# Patient Record
Sex: Male | Born: 1940 | Race: Black or African American | Hispanic: No | Marital: Married | Smoking: Former smoker
Health system: Southern US, Community
[De-identification: ages and names within clinical notes are randomized; demographics above are authoritative.]

## PROBLEM LIST (undated history)

## (undated) DIAGNOSIS — I4891 Unspecified atrial fibrillation: Secondary | ICD-10-CM

## (undated) DIAGNOSIS — I5020 Unspecified systolic (congestive) heart failure: Secondary | ICD-10-CM

## (undated) DIAGNOSIS — E7800 Pure hypercholesterolemia, unspecified: Secondary | ICD-10-CM

## (undated) DIAGNOSIS — I10 Essential (primary) hypertension: Secondary | ICD-10-CM

## (undated) HISTORY — PX: HERNIA REPAIR: SHX51

## (undated) HISTORY — PX: OTHER SURGICAL HISTORY: SHX169

## (undated) HISTORY — DX: Pure hypercholesterolemia, unspecified: E78.00

## (undated) HISTORY — DX: Essential (primary) hypertension: I10

## (undated) HISTORY — PX: TONSILLECTOMY: SUR1361

---

## 2010-10-15 ENCOUNTER — Encounter: Payer: Self-pay | Admitting: Pulmonary Disease

## 2010-10-26 ENCOUNTER — Encounter: Payer: Self-pay | Admitting: Pulmonary Disease

## 2010-11-12 ENCOUNTER — Encounter: Payer: Self-pay | Admitting: Pulmonary Disease

## 2010-11-12 ENCOUNTER — Institutional Professional Consult (permissible substitution) (INDEPENDENT_AMBULATORY_CARE_PROVIDER_SITE_OTHER): Payer: Medicare PPO | Admitting: Pulmonary Disease

## 2010-11-12 ENCOUNTER — Other Ambulatory Visit: Payer: Self-pay | Admitting: Pulmonary Disease

## 2010-11-12 DIAGNOSIS — J90 Pleural effusion, not elsewhere classified: Secondary | ICD-10-CM

## 2010-11-12 DIAGNOSIS — I10 Essential (primary) hypertension: Secondary | ICD-10-CM | POA: Insufficient documentation

## 2010-11-17 ENCOUNTER — Encounter: Payer: Self-pay | Admitting: Pulmonary Disease

## 2010-11-17 ENCOUNTER — Other Ambulatory Visit: Payer: Self-pay | Admitting: Pulmonary Disease

## 2010-11-17 ENCOUNTER — Ambulatory Visit (HOSPITAL_COMMUNITY)
Admission: RE | Admit: 2010-11-17 | Discharge: 2010-11-17 | Disposition: A | Discharge status: Home / Self Care | Payer: Medicare PPO | Source: Ambulatory Visit | Attending: Pulmonary Disease | Admitting: Pulmonary Disease

## 2010-11-17 ENCOUNTER — Other Ambulatory Visit: Payer: Self-pay | Admitting: Diagnostic Radiology

## 2010-11-17 DIAGNOSIS — Z9889 Other specified postprocedural states: Secondary | ICD-10-CM

## 2010-11-17 DIAGNOSIS — J90 Pleural effusion, not elsewhere classified: Secondary | ICD-10-CM | POA: Insufficient documentation

## 2010-11-17 LAB — LACTATE DEHYDROGENASE, PLEURAL OR PERITONEAL FLUID: LD, Fluid: 207 U/L — ABNORMAL HIGH (ref 3–23)

## 2010-11-17 LAB — PROTEIN, BODY FLUID: Total protein, fluid: 4.9 g/dL

## 2010-11-17 LAB — GLUCOSE, SEROUS FLUID: Glucose, Fluid: 147 mg/dL

## 2010-11-17 LAB — BODY FLUID CELL COUNT WITH DIFFERENTIAL
Eos, Fluid: 0 %
Neutrophil Count, Fluid: 2 % (ref 0–25)
Other Cells, Fluid: 0 %

## 2010-11-21 LAB — BODY FLUID CULTURE: Culture: NO GROWTH

## 2010-11-24 NOTE — Assessment & Plan Note (Signed)
Summary: large right plural effusion   Visit Type:  Initial Consult Primary Provider/Referring Provider:  Michel Santee eylk  CC:  short of breath x 1 month--mostly with exercise--sometimes he has to get out of bed and sit up to help with the SOB.  History of Present Illness: 69/M for evaluation of dyspnea & rt effusion.. c/o increasing dyspnea x 3 months  'they thought I was having pna , Abx would help , then it would come  back ' On coumadin x 5 yrs for A fibn CXR 10/15/10 >> persitent RLL infiltrate & effusion. CT chest 10/26/10 >> large rt effusion with passive atelectasis of rt lung, mosaic attenuation of left lung, multinodular goiter. CT scans done at [facility] (I am unable to view films)  Denies PND, orthopnea or chest apin c/o pedal edema - unclear if this is related to heart failure or amlodipine.  Preventive Screening-Counseling & Management  Alcohol-Tobacco     Smoking Status: quit     Year Quit: 1972  Current Medications (verified): 1)  Amlodipine Besylate 10 Mg Tabs (Amlodipine Besylate) .... Take 1 Tablet By Mouth Once A Day 2)  Aspirin 81 Mg  Tabs (Aspirin) .... Take 1 Tablet By Mouth Once A Day 3)  Benazepril Hcl 40 Mg Tabs (Benazepril Hcl) .... Take 1 Tablet By Mouth Once A Day 4)  Cardura 4 Mg Tabs (Doxazosin Mesylate) .... Take 1 Tablet By Mouth Once A Day 5)  Coreg 3.125 Mg Tabs (Carvedilol) .... Take 1 Tablet By Mouth Two Times A Day With Food 6)  Coumadin 5 Mg Tabs (Warfarin Sodium) .... Take 5mg  Alternating With 7.5mg  By Mouth 7)  Fish Oil 1000 Mg Caps (Omega-3 Fatty Acids) .... Take 1 Capsule By Mouth Once A Day 8)  Folic Acid 1 Mg Tabs (Folic Acid) .... Take 1 Tablet By Mouth Once A Day 9)  Lasix 20 Mg Tabs (Furosemide) .... Take 2 Tab By Mouth in The Morning and 1 Tab By Mouth Every Evening 10)  Novolin 70/30 70-30 % Susp (Insulin Isophane & Regular) .... Inject Subcutaneously As Directed 11)  Simvastatin 80 Mg Tabs (Simvastatin) .... Take 1 Tab By Mouth At  Bedtime  Allergies (verified): No Known Drug Allergies  Past History:  Family History: Last updated: 12/03/10 mother deceased age --31  sickle cell father deceased age--26  prostate cancer 1 brother killed in [place] 1 sister alive 4 other brothers  Social History: Last updated: 12/03/2010 married retired children  Past Medical History: Diabetes Hypertension  Past Surgical History: 4 hip surgeries----bil hip replacement tonsils removed  Family History: mother deceased age --13  sickle cell father deceased age--26  prostate cancer 1 brother killed in [place] 1 sister alive 4 other brothers  Social History: married retired childrenSmoking Status:  quit  Review of Systems       The patient complains of shortness of breath with activity, shortness of breath at rest, itching, hand/feet swelling, and joint stiffness or pain.  The patient denies productive cough, non-productive cough, coughing up blood, chest pain, irregular heartbeats, acid heartburn, indigestion, loss of appetite, weight change, abdominal pain, difficulty swallowing, sore throat, tooth/dental problems, headaches, nasal congestion/difficulty breathing through nose, sneezing, ear ache, anxiety, depression, rash, change in color of mucus, and fever.    Vital Signs:  Patient profile:   70 year old male Height:      73 inches Weight:      243.13 pounds BMI:     32.19 O2 Sat:  98 % on Room air Temp:     98.6 degrees F oral Pulse rate:   65 / minute BP sitting:   142 / 74  (right arm) Cuff size:   regular  Vitals Entered By: Randell Loop CMA (November 12, 2010 3:55 PM)  O2 Sat at Rest %:  98 O2 Flow:  Room air CC: short of breath x 1 month--mostly with exercise--sometimes he has to get out of bed and sit up to help with the SOB Is Patient Diabetic? No Pain Assessment Patient in pain? no      Comments pt didnt bring any meds with him today   Physical Exam  Additional Exam:  wt 243  November 13, 2010 Gen. Pleasant, well-nourished, in no distress, normal affect ENT - no lesions, no post nasal drip Neck: No JVD, no thyromegaly, no carotid bruits Lungs: no use of accessory muscles,  dullness to percussion on rt, decreased without rales or rhonchi  Cardiovascular: Rhythm regular, heart sounds  normal, no murmurs or gallops, no peripheral edema Abdomen: soft and non-tender, no hepatosplenomegaly, BS normal. Musculoskeletal: No deformities, no cyanosis or clubbing Neuro:  alert, non focal     Impression & Recommendations:  Problem # 1:  PLEURAL EFFUSION, RIGHT (ICD-511.9) Unclear cause of rt effusion Will obtain films from [place] to review. Proced w ith thoacentesis - The risks of the procedure including coughing, bleeding and the small chance of lung puncture requiring chest tube were discussed in great detail. The benefits & alternatives including serial follow up were also discussed.  he will stop coumadin for 5 days prior to procedure Send for cell count, chemistry, culture &  cytology,  If transudative will obtain echo  Orders: Consultation Level IV (16109) Radiology Referral (Radiology)  Medications Added to Medication List This Visit: 1)  Amlodipine Besylate 10 Mg Tabs (Amlodipine besylate) .... Take 1 tablet by mouth once a day 2)  Aspirin 81 Mg Tabs (Aspirin) .... Take 1 tablet by mouth once a day 3)  Benazepril Hcl 40 Mg Tabs (Benazepril hcl) .... Take 1 tablet by mouth once a day 4)  Cardura 4 Mg Tabs (Doxazosin mesylate) .... Take 1 tablet by mouth once a day 5)  Coreg 3.125 Mg Tabs (Carvedilol) .... Take 1 tablet by mouth two times a day with food 6)  Coumadin 5 Mg Tabs (Warfarin sodium) .... Take 5mg  alternating with 7.5mg  by mouth 7)  Fish Oil 1000 Mg Caps (Omega-3 fatty acids) .... Take 1 capsule by mouth once a day 8)  Folic Acid 1 Mg Tabs (Folic acid) .... Take 1 tablet by mouth once a day 9)  Lasix 20 Mg Tabs (Furosemide) .... Take 2 tab by mouth  in the morning and 1 tab by mouth every evening 10)  Novolin 70/30 70-30 % Susp (Insulin isophane & regular) .... Inject subcutaneously as directed 11)  Simvastatin 80 Mg Tabs (Simvastatin) .... Take 1 tab by mouth at bedtime  Patient Instructions: 1)  Copy sent to: Dr Leonia Reader - [place] 2)  Please schedule a follow-up appointment in 2 weeks. 3)  You have fluid collected around your right lung 4)  We have to draw some out for testing 5)  Risks including bleeding & lung puncture requiring tube were discussed 6)  STOP COUMADIN for at least 5 days prior to procedure

## 2010-12-15 LAB — FUNGUS CULTURE W SMEAR: Fungal Smear: NONE SEEN

## 2011-01-01 LAB — AFB CULTURE WITH SMEAR (NOT AT ARMC)

## 2011-04-29 ENCOUNTER — Encounter: Payer: Self-pay | Admitting: Podiatry

## 2011-04-29 DIAGNOSIS — B351 Tinea unguium: Secondary | ICD-10-CM | POA: Insufficient documentation

## 2011-04-29 DIAGNOSIS — E119 Type 2 diabetes mellitus without complications: Secondary | ICD-10-CM | POA: Insufficient documentation

## 2011-04-29 DIAGNOSIS — E7800 Pure hypercholesterolemia, unspecified: Secondary | ICD-10-CM | POA: Insufficient documentation

## 2012-03-02 ENCOUNTER — Ambulatory Visit (INDEPENDENT_AMBULATORY_CARE_PROVIDER_SITE_OTHER): Payer: Medicare PPO | Admitting: Internal Medicine

## 2012-03-02 ENCOUNTER — Encounter: Payer: Self-pay | Admitting: Internal Medicine

## 2012-03-02 ENCOUNTER — Other Ambulatory Visit (INDEPENDENT_AMBULATORY_CARE_PROVIDER_SITE_OTHER): Payer: Medicare PPO

## 2012-03-02 ENCOUNTER — Telehealth: Payer: Self-pay | Admitting: Internal Medicine

## 2012-03-02 ENCOUNTER — Ambulatory Visit (INDEPENDENT_AMBULATORY_CARE_PROVIDER_SITE_OTHER)
Admission: RE | Admit: 2012-03-02 | Discharge: 2012-03-02 | Disposition: A | Discharge status: Home / Self Care | Payer: Medicare PPO | Source: Ambulatory Visit | Attending: Internal Medicine | Admitting: Internal Medicine

## 2012-03-02 VITALS — BP 142/68 | HR 60 | Temp 98.2°F | Ht 73.0 in | Wt 265.2 lb

## 2012-03-02 DIAGNOSIS — R0600 Dyspnea, unspecified: Secondary | ICD-10-CM

## 2012-03-02 DIAGNOSIS — J90 Pleural effusion, not elsewhere classified: Secondary | ICD-10-CM

## 2012-03-02 DIAGNOSIS — R0609 Other forms of dyspnea: Secondary | ICD-10-CM

## 2012-03-02 DIAGNOSIS — R0989 Other specified symptoms and signs involving the circulatory and respiratory systems: Secondary | ICD-10-CM

## 2012-03-02 LAB — CBC
Hemoglobin: 13.7 g/dL (ref 13.0–17.0)
Platelets: 165 10*3/uL (ref 150.0–400.0)
RDW: 15.8 % — ABNORMAL HIGH (ref 11.5–14.6)
WBC: 6.6 10*3/uL (ref 4.5–10.5)

## 2012-03-02 LAB — BRAIN NATRIURETIC PEPTIDE: Pro B Natriuretic peptide (BNP): 300 pg/mL — ABNORMAL HIGH (ref 0.0–100.0)

## 2012-03-02 LAB — ALBUMIN: Albumin: 3.9 g/dL (ref 3.5–5.2)

## 2012-03-02 LAB — BASIC METABOLIC PANEL
Chloride: 104 mEq/L (ref 96–112)
Creatinine, Ser: 1 mg/dL (ref 0.4–1.5)
GFR: 77.94 mL/min (ref >60.00)
Potassium: 4.3 mEq/L (ref 3.5–5.1)

## 2012-03-02 LAB — LACTATE DEHYDROGENASE: LDH: 193 U/L (ref 94–250)

## 2012-03-02 NOTE — Patient Instructions (Signed)
Have blood work and cxr today Will call you with result and advice next step

## 2012-03-02 NOTE — Progress Notes (Signed)
Subjective:    Patient ID: Bruce Price, male    DOB: 07/06/1941, 71 y.o.   MRN: [id number]  HPI 71 year old male. PCP is No primary provider on file. Body mass index is 34.99 kg/(m^2).  reports that he has quit smoking. His smoking use included Cigarettes. He has a 60 pack-year smoking history. He has never used smokeless tobacco.  OV 03/02/2012  Saw Alva 11/12/10 for dyspnea. Had Rt thoracentesis for right pleural effusion 11/17/10 and result was non-diagnostic exudate (WC 7000 mostly lymphs, and LDH 207). AFter that lost to followup per his hx "no one called back". Reports that past 3-4 months having increased dyspnea on exertion that is slightly progressive. At baseline he has class 3 dyspnea and this is still class 3 but somewhat worse now. Denies associated chest pain, cough wheeze, orthoppnea, pnd. 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 Dyspnea relieved by rest. He reports he had CXR at [facility] and showed "fluid collection" and so he is back here.   Past, Family, Social reviewed: no change since last visit    Review of Systems  Constitutional: Negative for fever and unexpected weight change.  HENT: Positive for congestion and postnasal drip. Negative for ear pain, nosebleeds, sore throat, rhinorrhea, sneezing, trouble swallowing, dental problem and sinus pressure.   Eyes: Negative for redness and itching.  Respiratory: Positive for cough, shortness of breath and wheezing. Negative for chest tightness.   Cardiovascular: Negative for palpitations and leg swelling.  Gastrointestinal: Negative for nausea and vomiting.  Genitourinary: Negative for dysuria.  Musculoskeletal: Negative for joint swelling.  Skin: Negative for rash.  Neurological: Negative for headaches.  Hematological: Bruises/bleeds easily.  Psychiatric/Behavioral: Negative for dysphoric mood. The patient is not nervous/anxious.          Objective:   Physical Exam  Nursing note and vitals reviewed. Constitutional: He is oriented to person, place, and time. He appears well-developed and well-nourished. No distress.       Body mass index is 34.99 kg/(m^2).   HENT:  Head: Normocephalic and atraumatic.  Right Ear: External ear normal.  Left Ear: External ear normal.  Mouth/Throat: Oropharynx is clear and moist. No oropharyngeal exudate.  Eyes: Conjunctivae and EOM are normal. Pupils are equal, round, and reactive to light. Right eye exhibits no discharge. Left eye exhibits no discharge. No scleral icterus.  Neck: Normal range of motion. Neck supple. No JVD present. No tracheal deviation present. No thyromegaly present.  Cardiovascular: Normal rate, regular rhythm and intact distal pulses.  Exam reveals no gallop and no friction rub.   No murmur heard. Pulmonary/Chest: Effort normal and breath sounds normal. No respiratory distress. He has no wheezes. He has no rales. He exhibits no tenderness.  Abdominal: Soft. Bowel sounds are normal. He exhibits no distension and no mass. There is no tenderness. There is no rebound and no guarding.  Musculoskeletal: Normal range of motion. He exhibits no edema and no tenderness.       Uses cane  Lymphadenopathy:    He has no cervical adenopathy.  Neurological: He is alert and oriented to person, place, and time. He has normal reflexes. No cranial nerve deficit. Coordination normal.  Skin: Skin is warm and dry. No rash noted. He is not diaphoretic. No erythema. No pallor.  Psychiatric: He has a normal mood and affect. His behavior is normal. Judgment and thought content normal.  Assessment & Plan:

## 2012-03-02 NOTE — Telephone Encounter (Signed)
Dr Vassie Loll patient : Bruce Price no fluid collection and blood tests ok. Please inform him. He next needs PFT - I have ordered. And follwup with alva asap

## 2012-03-02 NOTE — Assessment & Plan Note (Signed)
Need to rule out pleural effusion recurrence as cause of dyspnea. So, wil get CXR and bnp. IF results, negative then will set up for PFT and followup with Dr Vassie Loll

## 2012-03-03 NOTE — Telephone Encounter (Signed)
Called # listed and received a message thanks for calling the party I am trying to reach will be notified I called wcb

## 2012-03-07 NOTE — Telephone Encounter (Signed)
Called # listed and received same message again wcb later

## 2012-03-13 NOTE — Telephone Encounter (Signed)
Called same # again and received same message wcb

## 2012-03-14 ENCOUNTER — Encounter: Payer: Self-pay | Admitting: *Deleted

## 2012-03-14 NOTE — Telephone Encounter (Signed)
ATC pt received same message. Will send pt a letter advising him to call the office

## 2012-03-15 ENCOUNTER — Telehealth: Payer: Self-pay | Admitting: Internal Medicine

## 2012-03-15 NOTE — Telephone Encounter (Signed)
RAMASWAMY,MURALI, MD 03/02/2012 10:40 PM Signed  Dr Vassie Loll patient : CXR no fluid collection and blood tests ok. Please inform him. He next needs PFT - I have ordered. And follwup with alva asap  Pt is aware and Shawna Orleans is setting up PFT and OV with RA.

## 2012-03-23 ENCOUNTER — Ambulatory Visit (INDEPENDENT_AMBULATORY_CARE_PROVIDER_SITE_OTHER): Payer: Medicare PPO | Admitting: Pulmonary Disease

## 2012-03-23 DIAGNOSIS — R0600 Dyspnea, unspecified: Secondary | ICD-10-CM

## 2012-03-23 DIAGNOSIS — R0609 Other forms of dyspnea: Secondary | ICD-10-CM

## 2012-03-23 LAB — PULMONARY FUNCTION TEST

## 2012-03-23 NOTE — Progress Notes (Signed)
PFT done today. 

## 2012-04-06 ENCOUNTER — Telehealth: Payer: Self-pay | Admitting: Internal Medicine

## 2012-04-06 NOTE — Telephone Encounter (Signed)
Pt is coming in 04/14/12 at 4:00. Will let RA know this

## 2012-04-06 NOTE — Telephone Encounter (Signed)
lmomtcb x1 

## 2012-04-06 NOTE — Telephone Encounter (Signed)
pft 03/23/12 - fev1 2L67%,  Ratio 82, TLC 4.6L/66^. DLCO 18/6/69%. All c/w restrction and low diffusion. Pleae give him appt to see Dr Vassie Loll

## 2012-04-06 NOTE — Telephone Encounter (Signed)
Pt returned call. Per mindy I have scheduled pt to see dr Vassie Loll on 04-14-12. Nothing further needed at this time per pt. Hazel Sams

## 2012-04-14 ENCOUNTER — Ambulatory Visit (INDEPENDENT_AMBULATORY_CARE_PROVIDER_SITE_OTHER): Payer: Medicare PPO | Admitting: Pulmonary Disease

## 2012-04-14 ENCOUNTER — Encounter: Payer: Self-pay | Admitting: Pulmonary Disease

## 2012-04-14 VITALS — HR 57 | Temp 98.2°F | Ht 72.0 in | Wt 267.8 lb

## 2012-04-14 DIAGNOSIS — J90 Pleural effusion, not elsewhere classified: Secondary | ICD-10-CM

## 2012-04-14 DIAGNOSIS — R0600 Dyspnea, unspecified: Secondary | ICD-10-CM

## 2012-04-14 DIAGNOSIS — R0989 Other specified symptoms and signs involving the circulatory and respiratory systems: Secondary | ICD-10-CM

## 2012-04-14 NOTE — Progress Notes (Signed)
  Subjective:    Patient ID: Bruce Price, male    DOB: Jul 23, 1941, 71 y.o.   MRN: [id number]  HPI PCP - [facility]  71 year old male heavy ex smoker   He has a 60 pack-year smoking history.   Had Rt thoracentesis for right pleural effusion 11/17/10 and result was non-diagnostic exudate (WC 7000 mostly lymphs, and LDH 207).  He  had CXR at [facility] that showed "fluid collection" but CXR here did not show infx or effusions.  04/14/2012 C/o bipedal edema  CXR 03/02/12 - no effusion. BNp 300 PFTs  03/23/12 - fev1 2L 67%, Ratio 82, TLC 4.6L/66^. DLCO 18/6/69%. All c/w restrction and low diffusion On coumadin for Af, INR was 1.9 last week, increased from 7 to 8 mg ? Compliance with lasix  Review of Systems Patient denies significant dyspnea,cough, hemoptysis,  chest pain, palpitations, pedal edema, orthopnea, paroxysmal nocturnal dyspnea, lightheadedness, nausea, vomiting, abdominal or  leg pains      Objective:   Physical Exam  Gen. Pleasant, obese, in no distress ENT - no lesions, no post nasal drip Neck: No JVD, no thyromegaly, no carotid bruits Lungs: no use of accessory muscles, no dullness to percussion, decreased without rales or rhonchi  Cardiovascular: Rhythm regular, heart sounds  normal, no murmurs or gallops, !+ peripheral edema Musculoskeletal: No deformities, no cyanosis or clubbing , no tremors       Assessment & Plan:

## 2012-04-14 NOTE — Patient Instructions (Addendum)
Weigh yourself every day & keep record Increase lasix 2 pills twice daily until  Weight drops by 5 lbs , then go back to usual dose

## 2012-04-19 NOTE — Assessment & Plan Note (Signed)
resolved 

## 2012-04-19 NOTE — Assessment & Plan Note (Signed)
Ex smoker, mild restriction on PFTs, no obstruction - does not have significant COPD Dyspnea may be due to diastolic dysfunction - have asked him to increase lasix until wt down by 5lbs then go back to usual dose Keep weight chart & fu with cardiologist

## 2012-04-20 ENCOUNTER — Ambulatory Visit: Payer: Medicare PPO | Admitting: Pulmonary Disease

## 2012-10-16 ENCOUNTER — Encounter: Payer: Self-pay | Admitting: Adult Health

## 2012-10-16 ENCOUNTER — Ambulatory Visit (INDEPENDENT_AMBULATORY_CARE_PROVIDER_SITE_OTHER): Payer: Medicare PPO | Admitting: Adult Health

## 2012-10-16 VITALS — BP 122/78 | HR 51 | Temp 97.6°F | Ht 73.0 in | Wt 268.0 lb

## 2012-10-16 DIAGNOSIS — R0989 Other specified symptoms and signs involving the circulatory and respiratory systems: Secondary | ICD-10-CM

## 2012-10-16 DIAGNOSIS — R0600 Dyspnea, unspecified: Secondary | ICD-10-CM

## 2012-10-16 NOTE — Assessment & Plan Note (Signed)
Suspect multifactorial dyspnea complicated by underlying diastolic dysfunction, mild restriction on PFTs, obesity, and multiple comorbidities Appears to be compensated on present regimen without flare in symptomology. Patient is advised to continue on diuretic therapy. Keep a low salt diet. He is encouraged on weight loss, and activity as tolerated

## 2012-10-16 NOTE — Progress Notes (Signed)
  Subjective:    Patient ID: Bruce Price, male    DOB: 03/18/1941, 72 y.o.   MRN: [id number]  HPI  PCP - [facility]  72 year old male heavy ex smoker   He has a 60 pack-year smoking history.   Had Rt thoracentesis for right pleural effusion 11/17/10 and result was non-diagnostic exudate (WC 7000 mostly lymphs, and LDH 207).  He  had CXR at [facility] that showed "fluid collection" but CXR here did not show infx or effusions.  04/14/12  C/o bipedal edema  CXR 03/02/12 - no effusion. BNp 300 PFTs  03/23/12 - fev1 2L 67%, Ratio 82, TLC 4.6L/66^. DLCO 18/6/69%. All c/w restrction and low diffusion On coumadin for Af, INR was 1.9 last week, increased from 7 to 8 mg ? Compliance with lasix  10/16/2012 Follow up  Patient returns for a six-month followup. Patient reports his last visit. He has been doing fairly well with no flare in cough, wheezing, shortness of breath, or lower extremity edema. Patient has a history of a right pleural effusion. That required thoracentesis with no evidence of recurrence on chest x-ray June 2013. He does have a history of smoking with previous pulmonary function test, showing no obstruction, and minimal restriction with an FEV1 of 67% He has suspected diastolic decompensation. Last visit. That resolved with increased Lasix Patient reports that her weight is stable with no flare in lower extending swelling. Since last visit Patient denies any hemoptysis, orthopnea, PND, or fever Review of Systems  Patient denies significant dyspnea,cough, hemoptysis,  chest pain, palpitations, pedal edema, orthopnea, paroxysmal nocturnal dyspnea, lightheadedness, nausea, vomiting, abdominal or  leg pains      Objective:   Physical Exam   Gen. Pleasant, obese, in no distress ENT - no lesions, no post nasal drip Neck: No JVD, no thyromegaly, no carotid bruits Lungs: no use of accessory muscles, no dullness to percussion, decreased without rales or rhonchi    Cardiovascular: Rhythm regular, heart sounds  normal, no murmurs or gallops, no peripheral edema Musculoskeletal: No deformities, no cyanosis or clubbing , no tremors       Assessment & Plan:

## 2012-10-16 NOTE — Patient Instructions (Addendum)
Continue on current regimen  Low salt diet  Please contact office for sooner follow up if symptoms do not improve or worsen or seek emergency care  follow up Dr. Vassie Loll  In 6 months

## 2013-05-07 ENCOUNTER — Ambulatory Visit: Payer: Medicare PPO | Admitting: Pulmonary Disease

## 2013-09-25 ENCOUNTER — Encounter: Payer: Self-pay | Admitting: Podiatrist

## 2013-09-25 ENCOUNTER — Ambulatory Visit (INDEPENDENT_AMBULATORY_CARE_PROVIDER_SITE_OTHER): Payer: Medicare PPO | Admitting: Podiatrist

## 2013-09-25 VITALS — BP 102/56 | HR 62 | Resp 18

## 2013-09-25 DIAGNOSIS — M79609 Pain in unspecified limb: Secondary | ICD-10-CM

## 2013-09-25 DIAGNOSIS — B351 Tinea unguium: Secondary | ICD-10-CM

## 2013-09-25 NOTE — Progress Notes (Signed)
I am here to get my toenails cut HPI:  Patient presents today for follow up of foot and nail care. Was diagnosed with bronchitis today and has started treatment  Objective:  Patients chart is reviewed.  Neurovascular status unchanged.  Patients nails are thickened, discolored, distrophic, friable and brittle with yellow-brown discoloration. Patient subjectively relates they are painful with shoes and with ambulation of bilateral feet.  Assessment:  Symptomatic onychomycosis  Plan:  Discussed treatment options and alternatives.  The symptomatic toenails were debrided through manual an mechanical means without complication.  Return appointment recommended at routine intervals of 3 months    Marlowe Aschoff, DPM

## 2013-09-25 NOTE — Patient Instructions (Signed)
Diabetes and Foot Care Diabetes may cause you to have problems because of poor blood supply (circulation) to your feet and legs. This may cause the skin on your feet to become thinner, break easier, and heal more slowly. Your skin may become dry, and the skin may peel and crack. You may also have nerve damage in your legs and feet causing decreased feeling in them. You may not notice minor injuries to your feet that could lead to infections or more serious problems. Taking care of your feet is one of the most important things you can do for yourself.  HOME CARE INSTRUCTIONS  Wear shoes at all times, even in the house. Do not go barefoot. Bare feet are easily injured.  Check your feet daily for blisters, cuts, and redness. If you cannot see the bottom of your feet, use a mirror or ask someone for help.  Wash your feet with warm water (do not use hot water) and mild soap. Then pat your feet and the areas between your toes until they are completely dry. Do not soak your feet as this can dry your skin.  Apply a moisturizing lotion or petroleum jelly (that does not contain alcohol and is unscented) to the skin on your feet and to dry, brittle toenails. Do not apply lotion between your toes.  Trim your toenails straight across. Do not dig under them or around the cuticle. File the edges of your nails with an emery board or nail file.  Do not cut corns or calluses or try to remove them with medicine.  Wear clean socks or stockings every day. Make sure they are not too tight. Do not wear knee-high stockings since they may decrease blood flow to your legs.  Wear shoes that fit properly and have enough cushioning. To break in new shoes, wear them for just a few hours a day. This prevents you from injuring your feet. Always look in your shoes before you put them on to be sure there are no objects inside.  Do not cross your legs. This may decrease the blood flow to your feet.  If you find a minor scrape,  cut, or break in the skin on your feet, keep it and the skin around it clean and dry. These areas may be cleansed with mild soap and water. Do not cleanse the area with peroxide, alcohol, or iodine.  When you remove an adhesive bandage, be sure not to damage the skin around it.  If you have a wound, look at it several times a day to make sure it is healing.  Do not use heating pads or hot water bottles. They may burn your skin. If you have lost feeling in your feet or legs, you may not know it is happening until it is too late.  Make sure your health care provider performs a complete foot exam at least annually or more often if you have foot problems. Report any cuts, sores, or bruises to your health care provider immediately. SEEK MEDICAL CARE IF:   You have an injury that is not healing.  You have cuts or breaks in the skin.  You have an ingrown nail.  You notice redness on your legs or feet.  You feel burning or tingling in your legs or feet.  You have pain or cramps in your legs and feet.  Your legs or feet are numb.  Your feet always feel cold. SEEK IMMEDIATE MEDICAL CARE IF:   There is increasing redness,   swelling, or pain in or around a wound.  There is a red line that goes up your leg.  Pus is coming from a wound.  You develop a fever or as directed by your health care provider.  You notice a bad smell coming from an ulcer or wound. Document Released: 09/10/2000 Document Revised: 05/16/2013 Document Reviewed: 02/20/2013 [employer] Patient Information 2014 [employer].  

## 2014-07-15 ENCOUNTER — Ambulatory Visit (INDEPENDENT_AMBULATORY_CARE_PROVIDER_SITE_OTHER): Payer: Medicare PPO

## 2014-07-15 DIAGNOSIS — E1140 Type 2 diabetes mellitus with diabetic neuropathy, unspecified: Secondary | ICD-10-CM

## 2014-07-15 DIAGNOSIS — M79676 Pain in unspecified toe(s): Secondary | ICD-10-CM

## 2014-07-15 DIAGNOSIS — B351 Tinea unguium: Secondary | ICD-10-CM

## 2014-07-15 NOTE — Patient Instructions (Signed)
Diabetes and Foot Care Diabetes may cause you to have problems because of poor blood supply (circulation) to your feet and legs. This may cause the skin on your feet to become thinner, break easier, and heal more slowly. Your skin may become dry, and the skin may peel and crack. You may also have nerve damage in your legs and feet causing decreased feeling in them. You may not notice minor injuries to your feet that could lead to infections or more serious problems. Taking care of your feet is one of the most important things you can do for yourself.  HOME CARE INSTRUCTIONS  Wear shoes at all times, even in the house. Do not go barefoot. Bare feet are easily injured.  Check your feet daily for blisters, cuts, and redness. If you cannot see the bottom of your feet, use a mirror or ask someone for help.  Wash your feet with warm water (do not use hot water) and mild soap. Then pat your feet and the areas between your toes until they are completely dry. Do not soak your feet as this can dry your skin.  Apply a moisturizing lotion or petroleum jelly (that does not contain alcohol and is unscented) to the skin on your feet and to dry, brittle toenails. Do not apply lotion between your toes.  Trim your toenails straight across. Do not dig under them or around the cuticle. File the edges of your nails with an emery board or nail file.  Do not cut corns or calluses or try to remove them with medicine.  Wear clean socks or stockings every day. Make sure they are not too tight. Do not wear knee-high stockings since they may decrease blood flow to your legs.  Wear shoes that fit properly and have enough cushioning. To break in new shoes, wear them for just a few hours a day. This prevents you from injuring your feet. Always look in your shoes before you put them on to be sure there are no objects inside.  Do not cross your legs. This may decrease the blood flow to your feet.  If you find a minor scrape,  cut, or break in the skin on your feet, keep it and the skin around it clean and dry. These areas may be cleansed with mild soap and water. Do not cleanse the area with peroxide, alcohol, or iodine.  When you remove an adhesive bandage, be sure not to damage the skin around it.  If you have a wound, look at it several times a day to make sure it is healing.  Do not use heating pads or hot water bottles. They may burn your skin. If you have lost feeling in your feet or legs, you may not know it is happening until it is too late.  Make sure your health care provider performs a complete foot exam at least annually or more often if you have foot problems. Report any cuts, sores, or bruises to your health care provider immediately. SEEK MEDICAL CARE IF:   You have an injury that is not healing.  You have cuts or breaks in the skin.  You have an ingrown nail.  You notice redness on your legs or feet.  You feel burning or tingling in your legs or feet.  You have pain or cramps in your legs and feet.  Your legs or feet are numb.  Your feet always feel cold. SEEK IMMEDIATE MEDICAL CARE IF:   There is increasing redness,   swelling, or pain in or around a wound.  There is a red line that goes up your leg.  Pus is coming from a wound.  You develop a fever or as directed by your health care provider.  You notice a bad smell coming from an ulcer or wound. Document Released: 09/10/2000 Document Revised: 05/16/2013 Document Reviewed: 02/20/2013 ExitCare Patient Information 2015 [employer]. This information is not intended to replace advice given to you by your health care provider. Make sure you discuss any questions you have with your health care provider.  

## 2014-07-15 NOTE — Progress Notes (Signed)
   Subjective:    Patient ID: Bruce Price, male    DOB: July 14, 1941, 73 y.o.   MRN: [id number]  HPI  TOENAILS TRIM.  Review of Systems  Constitutional: Positive for unexpected weight change.  Eyes: Positive for visual disturbance.  All other systems reviewed and are negative.      Objective:   Physical Exam  73 year old [place] American male presents at this time nearly a year since last visit. Patient has thick criptotic ingrowing nails tender and painful both on ambulation with enclosed shoe wear 1 through 5 bilateral nails have not been cut in 9-10 months. Doesn't history of diabetes and complications as well and decreased epicritic sensations are noted on Semmes Weinstein testing to forefoot and digits. Vascular status reveals pedal pulses intact DP +2/4 bilateral PT one over 4 bilateral capillary refill time 3 seconds all digits epicritic sensations diminished as mentioned to the forefoot digits and arch is normal plantar response DTRs not elicited. Neurologically skin color pigment normal hair growth absent nails thick criptotic incurvated brittle friable discolored a greater than an inch and excess length there is distal pterygium formation on the nails on debridement third right history with lumicain Neosporin to the hyponychium growth. No secondary infection is noted no ascending psoas lymphangitis no open wounds no ulcers patient ambulating in some slip on shoes which are inappropriate recommended a lace up walking or athletic shoe       Assessment & Plan:  Assessment diabetes with history peripheral neuropathy and mild angiopathy at this time thick brittle crumbly friable mycotic nails 1 through 5 bilateral debrided third right treated with lumicain and Neosporin suggest a 3 or six-month long-term followup for continued palliative care in the future as needed next progress Alvan Dame DPM

## 2014-08-09 DIAGNOSIS — E119 Type 2 diabetes mellitus without complications: Secondary | ICD-10-CM | POA: Diagnosis not present

## 2014-08-09 DIAGNOSIS — N400 Enlarged prostate without lower urinary tract symptoms: Secondary | ICD-10-CM | POA: Diagnosis not present

## 2014-08-09 DIAGNOSIS — I10 Essential (primary) hypertension: Secondary | ICD-10-CM | POA: Diagnosis not present

## 2014-10-01 DIAGNOSIS — I4891 Unspecified atrial fibrillation: Secondary | ICD-10-CM | POA: Diagnosis not present

## 2014-11-01 DIAGNOSIS — R0600 Dyspnea, unspecified: Secondary | ICD-10-CM | POA: Diagnosis not present

## 2014-11-01 DIAGNOSIS — I10 Essential (primary) hypertension: Secondary | ICD-10-CM | POA: Diagnosis not present

## 2014-11-01 DIAGNOSIS — E119 Type 2 diabetes mellitus without complications: Secondary | ICD-10-CM | POA: Diagnosis not present

## 2014-11-01 DIAGNOSIS — I482 Chronic atrial fibrillation: Secondary | ICD-10-CM | POA: Diagnosis not present

## 2014-11-01 DIAGNOSIS — R0602 Shortness of breath: Secondary | ICD-10-CM | POA: Diagnosis not present

## 2014-11-04 DIAGNOSIS — Z23 Encounter for immunization: Secondary | ICD-10-CM | POA: Diagnosis not present

## 2014-11-17 DIAGNOSIS — R7309 Other abnormal glucose: Secondary | ICD-10-CM | POA: Diagnosis not present

## 2014-11-17 DIAGNOSIS — Z794 Long term (current) use of insulin: Secondary | ICD-10-CM | POA: Diagnosis not present

## 2014-11-17 DIAGNOSIS — I10 Essential (primary) hypertension: Secondary | ICD-10-CM | POA: Diagnosis not present

## 2014-11-17 DIAGNOSIS — E11649 Type 2 diabetes mellitus with hypoglycemia without coma: Secondary | ICD-10-CM | POA: Diagnosis not present

## 2014-11-17 DIAGNOSIS — E161 Other hypoglycemia: Secondary | ICD-10-CM | POA: Diagnosis not present

## 2014-11-17 DIAGNOSIS — Z7901 Long term (current) use of anticoagulants: Secondary | ICD-10-CM | POA: Diagnosis not present

## 2014-11-17 DIAGNOSIS — I4891 Unspecified atrial fibrillation: Secondary | ICD-10-CM | POA: Diagnosis not present

## 2014-11-17 DIAGNOSIS — Z79899 Other long term (current) drug therapy: Secondary | ICD-10-CM | POA: Diagnosis not present

## 2014-11-18 ENCOUNTER — Ambulatory Visit: Payer: Medicare PPO

## 2014-11-18 DIAGNOSIS — R0602 Shortness of breath: Secondary | ICD-10-CM | POA: Diagnosis not present

## 2014-11-20 DIAGNOSIS — E119 Type 2 diabetes mellitus without complications: Secondary | ICD-10-CM | POA: Diagnosis not present

## 2014-12-03 DIAGNOSIS — I4891 Unspecified atrial fibrillation: Secondary | ICD-10-CM | POA: Diagnosis not present

## 2014-12-30 DIAGNOSIS — H26493 Other secondary cataract, bilateral: Secondary | ICD-10-CM | POA: Diagnosis not present

## 2014-12-30 DIAGNOSIS — E119 Type 2 diabetes mellitus without complications: Secondary | ICD-10-CM | POA: Diagnosis not present

## 2015-01-03 DIAGNOSIS — I4891 Unspecified atrial fibrillation: Secondary | ICD-10-CM | POA: Diagnosis not present

## 2015-01-03 DIAGNOSIS — Z7901 Long term (current) use of anticoagulants: Secondary | ICD-10-CM | POA: Diagnosis not present

## 2015-01-30 DIAGNOSIS — I10 Essential (primary) hypertension: Secondary | ICD-10-CM | POA: Diagnosis not present

## 2015-01-30 DIAGNOSIS — E1165 Type 2 diabetes mellitus with hyperglycemia: Secondary | ICD-10-CM | POA: Diagnosis not present

## 2015-02-13 DIAGNOSIS — I4891 Unspecified atrial fibrillation: Secondary | ICD-10-CM | POA: Diagnosis not present

## 2015-02-17 DIAGNOSIS — I4891 Unspecified atrial fibrillation: Secondary | ICD-10-CM | POA: Diagnosis not present

## 2015-03-20 DIAGNOSIS — I4891 Unspecified atrial fibrillation: Secondary | ICD-10-CM | POA: Diagnosis not present

## 2015-04-08 DIAGNOSIS — S81802A Unspecified open wound, left lower leg, initial encounter: Secondary | ICD-10-CM | POA: Diagnosis not present

## 2015-04-08 DIAGNOSIS — M7989 Other specified soft tissue disorders: Secondary | ICD-10-CM | POA: Diagnosis not present

## 2015-04-08 DIAGNOSIS — I87322 Chronic venous hypertension (idiopathic) with inflammation of left lower extremity: Secondary | ICD-10-CM | POA: Diagnosis not present

## 2015-04-08 DIAGNOSIS — I87332 Chronic venous hypertension (idiopathic) with ulcer and inflammation of left lower extremity: Secondary | ICD-10-CM | POA: Diagnosis not present

## 2015-04-15 DIAGNOSIS — I83029 Varicose veins of left lower extremity with ulcer of unspecified site: Secondary | ICD-10-CM | POA: Diagnosis not present

## 2015-04-16 DIAGNOSIS — I10 Essential (primary) hypertension: Secondary | ICD-10-CM | POA: Diagnosis not present

## 2015-04-16 DIAGNOSIS — Z794 Long term (current) use of insulin: Secondary | ICD-10-CM | POA: Diagnosis not present

## 2015-04-16 DIAGNOSIS — Z87891 Personal history of nicotine dependence: Secondary | ICD-10-CM | POA: Diagnosis not present

## 2015-04-16 DIAGNOSIS — E11622 Type 2 diabetes mellitus with other skin ulcer: Secondary | ICD-10-CM | POA: Diagnosis not present

## 2015-04-16 DIAGNOSIS — E119 Type 2 diabetes mellitus without complications: Secondary | ICD-10-CM | POA: Diagnosis not present

## 2015-04-16 DIAGNOSIS — M109 Gout, unspecified: Secondary | ICD-10-CM | POA: Diagnosis not present

## 2015-04-16 DIAGNOSIS — L97821 Non-pressure chronic ulcer of other part of left lower leg limited to breakdown of skin: Secondary | ICD-10-CM | POA: Diagnosis not present

## 2015-04-16 DIAGNOSIS — M1990 Unspecified osteoarthritis, unspecified site: Secondary | ICD-10-CM | POA: Diagnosis not present

## 2015-04-16 DIAGNOSIS — G4730 Sleep apnea, unspecified: Secondary | ICD-10-CM | POA: Diagnosis not present

## 2015-04-16 DIAGNOSIS — I509 Heart failure, unspecified: Secondary | ICD-10-CM | POA: Diagnosis not present

## 2015-04-18 DIAGNOSIS — I4891 Unspecified atrial fibrillation: Secondary | ICD-10-CM | POA: Diagnosis not present

## 2015-04-23 DIAGNOSIS — E11622 Type 2 diabetes mellitus with other skin ulcer: Secondary | ICD-10-CM | POA: Diagnosis not present

## 2015-04-23 DIAGNOSIS — L97821 Non-pressure chronic ulcer of other part of left lower leg limited to breakdown of skin: Secondary | ICD-10-CM | POA: Diagnosis not present

## 2015-04-28 DIAGNOSIS — S81802A Unspecified open wound, left lower leg, initial encounter: Secondary | ICD-10-CM | POA: Diagnosis not present

## 2015-04-28 DIAGNOSIS — L97222 Non-pressure chronic ulcer of left calf with fat layer exposed: Secondary | ICD-10-CM | POA: Diagnosis not present

## 2015-04-28 DIAGNOSIS — Z87891 Personal history of nicotine dependence: Secondary | ICD-10-CM | POA: Diagnosis not present

## 2015-04-28 DIAGNOSIS — E780 Pure hypercholesterolemia: Secondary | ICD-10-CM | POA: Diagnosis not present

## 2015-04-28 DIAGNOSIS — I771 Stricture of artery: Secondary | ICD-10-CM | POA: Diagnosis not present

## 2015-04-28 DIAGNOSIS — E119 Type 2 diabetes mellitus without complications: Secondary | ICD-10-CM | POA: Diagnosis not present

## 2015-04-28 DIAGNOSIS — I871 Compression of vein: Secondary | ICD-10-CM | POA: Diagnosis not present

## 2015-04-28 DIAGNOSIS — Z96643 Presence of artificial hip joint, bilateral: Secondary | ICD-10-CM | POA: Diagnosis not present

## 2015-04-30 DIAGNOSIS — E11622 Type 2 diabetes mellitus with other skin ulcer: Secondary | ICD-10-CM | POA: Diagnosis not present

## 2015-04-30 DIAGNOSIS — L97821 Non-pressure chronic ulcer of other part of left lower leg limited to breakdown of skin: Secondary | ICD-10-CM | POA: Diagnosis not present

## 2015-05-01 DIAGNOSIS — L97909 Non-pressure chronic ulcer of unspecified part of unspecified lower leg with unspecified severity: Secondary | ICD-10-CM | POA: Diagnosis not present

## 2015-05-07 DIAGNOSIS — Z8631 Personal history of diabetic foot ulcer: Secondary | ICD-10-CM | POA: Diagnosis not present

## 2015-05-07 DIAGNOSIS — L97821 Non-pressure chronic ulcer of other part of left lower leg limited to breakdown of skin: Secondary | ICD-10-CM | POA: Diagnosis not present

## 2015-05-07 DIAGNOSIS — E11622 Type 2 diabetes mellitus with other skin ulcer: Secondary | ICD-10-CM | POA: Diagnosis not present

## 2015-05-07 DIAGNOSIS — E119 Type 2 diabetes mellitus without complications: Secondary | ICD-10-CM | POA: Diagnosis not present

## 2015-05-07 DIAGNOSIS — Z09 Encounter for follow-up examination after completed treatment for conditions other than malignant neoplasm: Secondary | ICD-10-CM | POA: Diagnosis not present

## 2015-05-07 DIAGNOSIS — Z872 Personal history of diseases of the skin and subcutaneous tissue: Secondary | ICD-10-CM | POA: Diagnosis not present

## 2015-05-19 DIAGNOSIS — I4891 Unspecified atrial fibrillation: Secondary | ICD-10-CM | POA: Diagnosis not present

## 2015-05-23 DIAGNOSIS — I4891 Unspecified atrial fibrillation: Secondary | ICD-10-CM | POA: Diagnosis not present

## 2015-05-27 DIAGNOSIS — I10 Essential (primary) hypertension: Secondary | ICD-10-CM | POA: Diagnosis not present

## 2015-05-27 DIAGNOSIS — R0602 Shortness of breath: Secondary | ICD-10-CM | POA: Diagnosis not present

## 2015-06-09 ENCOUNTER — Encounter: Payer: Self-pay | Admitting: Podiatry

## 2015-06-09 ENCOUNTER — Ambulatory Visit (INDEPENDENT_AMBULATORY_CARE_PROVIDER_SITE_OTHER): Payer: 59 | Admitting: Podiatry

## 2015-06-09 DIAGNOSIS — E1140 Type 2 diabetes mellitus with diabetic neuropathy, unspecified: Secondary | ICD-10-CM

## 2015-06-09 DIAGNOSIS — B351 Tinea unguium: Secondary | ICD-10-CM | POA: Diagnosis not present

## 2015-06-09 DIAGNOSIS — M79676 Pain in unspecified toe(s): Secondary | ICD-10-CM | POA: Diagnosis not present

## 2015-06-09 NOTE — Progress Notes (Signed)
Patient ID: Bruce Price, male   DOB: [birth year], 74 y.o.   MRN: [id number] HPI  Complaint:  Visit Type: Patient returns to my office for continued preventative foot care services. Complaint: Patient states" my nails have grown long and thick and become painful to walk and wear shoes" Patient has been diagnosed with DM . This patient  presents for preventative foot care services. No changes to ROS.He has not been seen for over a year and on Saturday says his big toenail right foot came off.  No drainage noted or infection. Podiatric Exam: Vascular: dorsalis pedis and posterior tibial pulses are non-palpable due to swelling in his feet.. Capillary return is diminished. Cold feet noted.. Skin turgor WNL, bilateral swelling  Sensorium: Diminished  Semmes Weinstein monofilament test. Normal tactile sensation bilaterally.  Nail Exam: Pt has thick disfigured discolored nails with subungual debris noted bilateral entire nail hallux through fifth toenails except big toenail right foot.  He has no evidence of redness or infection from avulsed nail. Ulcer Exam: There is no evidence of ulcer or pre-ulcerative changes or infection. Orthopedic Exam: Muscle tone and strength are WNL. No limitations in general ROM. No crepitus or effusions noted. Foot type and digits show no abnormalities. Bony prominences are unremarkable. Skin: No Porokeratosis. No infection or ulcer.  Distal pterygium multiple nails both feet.  Diagnosis:  Onychomycosis, Pain in right toe, pain in left toes  Treatment & Plan Procedures and Treatment: Consent by patient was obtained for treatment procedures. The patient understood the discussion of treatment and procedures well. All questions were answered thoroughly reviewed. Debridement of mycotic and hypertrophic toenails, 1 through 5 bilateral and clearing of subungual debris. No ulceration, no infection noted.  Return Visit-Office Procedure: Patient instructed to return to the office  for a follow up visit 3 months for continued evaluation and treatment.

## 2015-06-23 DIAGNOSIS — I4891 Unspecified atrial fibrillation: Secondary | ICD-10-CM | POA: Diagnosis not present

## 2015-07-11 DIAGNOSIS — I482 Chronic atrial fibrillation: Secondary | ICD-10-CM | POA: Diagnosis not present

## 2015-07-11 DIAGNOSIS — E1142 Type 2 diabetes mellitus with diabetic polyneuropathy: Secondary | ICD-10-CM | POA: Diagnosis not present

## 2015-07-11 DIAGNOSIS — E785 Hyperlipidemia, unspecified: Secondary | ICD-10-CM | POA: Diagnosis not present

## 2015-07-11 DIAGNOSIS — I25118 Atherosclerotic heart disease of native coronary artery with other forms of angina pectoris: Secondary | ICD-10-CM | POA: Diagnosis not present

## 2015-07-11 DIAGNOSIS — I10 Essential (primary) hypertension: Secondary | ICD-10-CM | POA: Diagnosis not present

## 2015-07-11 DIAGNOSIS — Z23 Encounter for immunization: Secondary | ICD-10-CM | POA: Diagnosis not present

## 2015-07-11 DIAGNOSIS — N400 Enlarged prostate without lower urinary tract symptoms: Secondary | ICD-10-CM | POA: Diagnosis not present

## 2015-07-11 DIAGNOSIS — Z1389 Encounter for screening for other disorder: Secondary | ICD-10-CM | POA: Diagnosis not present

## 2015-07-14 DIAGNOSIS — E1142 Type 2 diabetes mellitus with diabetic polyneuropathy: Secondary | ICD-10-CM | POA: Diagnosis not present

## 2015-07-14 DIAGNOSIS — N400 Enlarged prostate without lower urinary tract symptoms: Secondary | ICD-10-CM | POA: Diagnosis not present

## 2015-07-14 DIAGNOSIS — I482 Chronic atrial fibrillation: Secondary | ICD-10-CM | POA: Diagnosis not present

## 2015-07-15 DIAGNOSIS — J811 Chronic pulmonary edema: Secondary | ICD-10-CM | POA: Diagnosis not present

## 2015-07-15 DIAGNOSIS — I4891 Unspecified atrial fibrillation: Secondary | ICD-10-CM | POA: Diagnosis not present

## 2015-07-15 DIAGNOSIS — R0602 Shortness of breath: Secondary | ICD-10-CM | POA: Diagnosis not present

## 2015-07-15 DIAGNOSIS — R9439 Abnormal result of other cardiovascular function study: Secondary | ICD-10-CM | POA: Diagnosis not present

## 2015-07-15 DIAGNOSIS — I10 Essential (primary) hypertension: Secondary | ICD-10-CM | POA: Diagnosis not present

## 2015-07-15 DIAGNOSIS — Z7901 Long term (current) use of anticoagulants: Secondary | ICD-10-CM | POA: Diagnosis not present

## 2015-07-15 DIAGNOSIS — E088 Diabetes mellitus due to underlying condition with unspecified complications: Secondary | ICD-10-CM | POA: Diagnosis not present

## 2015-07-15 DIAGNOSIS — J90 Pleural effusion, not elsewhere classified: Secondary | ICD-10-CM | POA: Diagnosis not present

## 2015-07-17 DIAGNOSIS — Z79899 Other long term (current) drug therapy: Secondary | ICD-10-CM | POA: Diagnosis not present

## 2015-07-17 DIAGNOSIS — I501 Left ventricular failure: Secondary | ICD-10-CM | POA: Diagnosis not present

## 2015-07-17 DIAGNOSIS — Z7901 Long term (current) use of anticoagulants: Secondary | ICD-10-CM | POA: Diagnosis not present

## 2015-07-17 DIAGNOSIS — I2510 Atherosclerotic heart disease of native coronary artery without angina pectoris: Secondary | ICD-10-CM | POA: Diagnosis not present

## 2015-07-17 DIAGNOSIS — E118 Type 2 diabetes mellitus with unspecified complications: Secondary | ICD-10-CM | POA: Diagnosis not present

## 2015-07-17 DIAGNOSIS — I10 Essential (primary) hypertension: Secondary | ICD-10-CM | POA: Diagnosis not present

## 2015-07-17 DIAGNOSIS — Z794 Long term (current) use of insulin: Secondary | ICD-10-CM | POA: Diagnosis not present

## 2015-07-17 DIAGNOSIS — I482 Chronic atrial fibrillation: Secondary | ICD-10-CM | POA: Diagnosis not present

## 2015-07-22 DIAGNOSIS — I4891 Unspecified atrial fibrillation: Secondary | ICD-10-CM | POA: Diagnosis not present

## 2015-07-23 DIAGNOSIS — R262 Difficulty in walking, not elsewhere classified: Secondary | ICD-10-CM | POA: Diagnosis not present

## 2015-07-23 DIAGNOSIS — D573 Sickle-cell trait: Secondary | ICD-10-CM | POA: Diagnosis not present

## 2015-07-23 DIAGNOSIS — I10 Essential (primary) hypertension: Secondary | ICD-10-CM | POA: Diagnosis not present

## 2015-07-23 DIAGNOSIS — I482 Chronic atrial fibrillation: Secondary | ICD-10-CM | POA: Diagnosis not present

## 2015-07-23 DIAGNOSIS — I2510 Atherosclerotic heart disease of native coronary artery without angina pectoris: Secondary | ICD-10-CM | POA: Diagnosis not present

## 2015-07-23 DIAGNOSIS — E1142 Type 2 diabetes mellitus with diabetic polyneuropathy: Secondary | ICD-10-CM | POA: Diagnosis not present

## 2015-07-25 DIAGNOSIS — I10 Essential (primary) hypertension: Secondary | ICD-10-CM | POA: Diagnosis not present

## 2015-07-25 DIAGNOSIS — D573 Sickle-cell trait: Secondary | ICD-10-CM | POA: Diagnosis not present

## 2015-07-25 DIAGNOSIS — E1142 Type 2 diabetes mellitus with diabetic polyneuropathy: Secondary | ICD-10-CM | POA: Diagnosis not present

## 2015-07-25 DIAGNOSIS — I482 Chronic atrial fibrillation: Secondary | ICD-10-CM | POA: Diagnosis not present

## 2015-07-25 DIAGNOSIS — R262 Difficulty in walking, not elsewhere classified: Secondary | ICD-10-CM | POA: Diagnosis not present

## 2015-07-25 DIAGNOSIS — I2510 Atherosclerotic heart disease of native coronary artery without angina pectoris: Secondary | ICD-10-CM | POA: Diagnosis not present

## 2015-07-28 DIAGNOSIS — N401 Enlarged prostate with lower urinary tract symptoms: Secondary | ICD-10-CM | POA: Diagnosis not present

## 2015-07-28 DIAGNOSIS — R972 Elevated prostate specific antigen [PSA]: Secondary | ICD-10-CM | POA: Diagnosis not present

## 2015-07-29 DIAGNOSIS — I2510 Atherosclerotic heart disease of native coronary artery without angina pectoris: Secondary | ICD-10-CM | POA: Diagnosis not present

## 2015-07-29 DIAGNOSIS — R262 Difficulty in walking, not elsewhere classified: Secondary | ICD-10-CM | POA: Diagnosis not present

## 2015-07-29 DIAGNOSIS — I482 Chronic atrial fibrillation: Secondary | ICD-10-CM | POA: Diagnosis not present

## 2015-07-29 DIAGNOSIS — I10 Essential (primary) hypertension: Secondary | ICD-10-CM | POA: Diagnosis not present

## 2015-07-29 DIAGNOSIS — D573 Sickle-cell trait: Secondary | ICD-10-CM | POA: Diagnosis not present

## 2015-07-29 DIAGNOSIS — E1142 Type 2 diabetes mellitus with diabetic polyneuropathy: Secondary | ICD-10-CM | POA: Diagnosis not present

## 2015-07-31 DIAGNOSIS — I482 Chronic atrial fibrillation: Secondary | ICD-10-CM | POA: Diagnosis not present

## 2015-07-31 DIAGNOSIS — E1142 Type 2 diabetes mellitus with diabetic polyneuropathy: Secondary | ICD-10-CM | POA: Diagnosis not present

## 2015-07-31 DIAGNOSIS — I10 Essential (primary) hypertension: Secondary | ICD-10-CM | POA: Diagnosis not present

## 2015-07-31 DIAGNOSIS — D573 Sickle-cell trait: Secondary | ICD-10-CM | POA: Diagnosis not present

## 2015-07-31 DIAGNOSIS — I2510 Atherosclerotic heart disease of native coronary artery without angina pectoris: Secondary | ICD-10-CM | POA: Diagnosis not present

## 2015-07-31 DIAGNOSIS — R262 Difficulty in walking, not elsewhere classified: Secondary | ICD-10-CM | POA: Diagnosis not present

## 2015-08-04 DIAGNOSIS — I10 Essential (primary) hypertension: Secondary | ICD-10-CM | POA: Diagnosis not present

## 2015-08-04 DIAGNOSIS — D573 Sickle-cell trait: Secondary | ICD-10-CM | POA: Diagnosis not present

## 2015-08-04 DIAGNOSIS — I2510 Atherosclerotic heart disease of native coronary artery without angina pectoris: Secondary | ICD-10-CM | POA: Diagnosis not present

## 2015-08-04 DIAGNOSIS — I482 Chronic atrial fibrillation: Secondary | ICD-10-CM | POA: Diagnosis not present

## 2015-08-04 DIAGNOSIS — R262 Difficulty in walking, not elsewhere classified: Secondary | ICD-10-CM | POA: Diagnosis not present

## 2015-08-04 DIAGNOSIS — E1142 Type 2 diabetes mellitus with diabetic polyneuropathy: Secondary | ICD-10-CM | POA: Diagnosis not present

## 2015-08-06 DIAGNOSIS — E1142 Type 2 diabetes mellitus with diabetic polyneuropathy: Secondary | ICD-10-CM | POA: Diagnosis not present

## 2015-08-06 DIAGNOSIS — I482 Chronic atrial fibrillation: Secondary | ICD-10-CM | POA: Diagnosis not present

## 2015-08-06 DIAGNOSIS — I2510 Atherosclerotic heart disease of native coronary artery without angina pectoris: Secondary | ICD-10-CM | POA: Diagnosis not present

## 2015-08-06 DIAGNOSIS — R262 Difficulty in walking, not elsewhere classified: Secondary | ICD-10-CM | POA: Diagnosis not present

## 2015-08-06 DIAGNOSIS — I10 Essential (primary) hypertension: Secondary | ICD-10-CM | POA: Diagnosis not present

## 2015-08-06 DIAGNOSIS — D573 Sickle-cell trait: Secondary | ICD-10-CM | POA: Diagnosis not present

## 2015-08-12 DIAGNOSIS — I10 Essential (primary) hypertension: Secondary | ICD-10-CM | POA: Diagnosis not present

## 2015-08-12 DIAGNOSIS — I482 Chronic atrial fibrillation: Secondary | ICD-10-CM | POA: Diagnosis not present

## 2015-08-12 DIAGNOSIS — D573 Sickle-cell trait: Secondary | ICD-10-CM | POA: Diagnosis not present

## 2015-08-12 DIAGNOSIS — I2510 Atherosclerotic heart disease of native coronary artery without angina pectoris: Secondary | ICD-10-CM | POA: Diagnosis not present

## 2015-08-12 DIAGNOSIS — E1142 Type 2 diabetes mellitus with diabetic polyneuropathy: Secondary | ICD-10-CM | POA: Diagnosis not present

## 2015-08-12 DIAGNOSIS — R262 Difficulty in walking, not elsewhere classified: Secondary | ICD-10-CM | POA: Diagnosis not present

## 2015-08-14 DIAGNOSIS — I482 Chronic atrial fibrillation: Secondary | ICD-10-CM | POA: Diagnosis not present

## 2015-08-14 DIAGNOSIS — I2510 Atherosclerotic heart disease of native coronary artery without angina pectoris: Secondary | ICD-10-CM | POA: Diagnosis not present

## 2015-08-14 DIAGNOSIS — I10 Essential (primary) hypertension: Secondary | ICD-10-CM | POA: Diagnosis not present

## 2015-08-14 DIAGNOSIS — E1142 Type 2 diabetes mellitus with diabetic polyneuropathy: Secondary | ICD-10-CM | POA: Diagnosis not present

## 2015-08-14 DIAGNOSIS — R262 Difficulty in walking, not elsewhere classified: Secondary | ICD-10-CM | POA: Diagnosis not present

## 2015-08-14 DIAGNOSIS — D573 Sickle-cell trait: Secondary | ICD-10-CM | POA: Diagnosis not present

## 2015-08-19 DIAGNOSIS — E1142 Type 2 diabetes mellitus with diabetic polyneuropathy: Secondary | ICD-10-CM | POA: Diagnosis not present

## 2015-08-19 DIAGNOSIS — I10 Essential (primary) hypertension: Secondary | ICD-10-CM | POA: Diagnosis not present

## 2015-08-19 DIAGNOSIS — R262 Difficulty in walking, not elsewhere classified: Secondary | ICD-10-CM | POA: Diagnosis not present

## 2015-08-19 DIAGNOSIS — D573 Sickle-cell trait: Secondary | ICD-10-CM | POA: Diagnosis not present

## 2015-08-19 DIAGNOSIS — I482 Chronic atrial fibrillation: Secondary | ICD-10-CM | POA: Diagnosis not present

## 2015-08-19 DIAGNOSIS — I2510 Atherosclerotic heart disease of native coronary artery without angina pectoris: Secondary | ICD-10-CM | POA: Diagnosis not present

## 2015-08-20 DIAGNOSIS — E785 Hyperlipidemia, unspecified: Secondary | ICD-10-CM | POA: Diagnosis not present

## 2015-08-20 DIAGNOSIS — I4891 Unspecified atrial fibrillation: Secondary | ICD-10-CM | POA: Diagnosis not present

## 2015-08-20 DIAGNOSIS — I10 Essential (primary) hypertension: Secondary | ICD-10-CM | POA: Diagnosis not present

## 2015-08-20 DIAGNOSIS — R0602 Shortness of breath: Secondary | ICD-10-CM | POA: Diagnosis not present

## 2015-08-20 DIAGNOSIS — E088 Diabetes mellitus due to underlying condition with unspecified complications: Secondary | ICD-10-CM | POA: Diagnosis not present

## 2015-08-25 DIAGNOSIS — I10 Essential (primary) hypertension: Secondary | ICD-10-CM | POA: Diagnosis not present

## 2015-08-25 DIAGNOSIS — I482 Chronic atrial fibrillation: Secondary | ICD-10-CM | POA: Diagnosis not present

## 2015-08-25 DIAGNOSIS — R262 Difficulty in walking, not elsewhere classified: Secondary | ICD-10-CM | POA: Diagnosis not present

## 2015-08-25 DIAGNOSIS — D573 Sickle-cell trait: Secondary | ICD-10-CM | POA: Diagnosis not present

## 2015-08-25 DIAGNOSIS — E1142 Type 2 diabetes mellitus with diabetic polyneuropathy: Secondary | ICD-10-CM | POA: Diagnosis not present

## 2015-08-25 DIAGNOSIS — I2510 Atherosclerotic heart disease of native coronary artery without angina pectoris: Secondary | ICD-10-CM | POA: Diagnosis not present

## 2015-08-27 DIAGNOSIS — E782 Mixed hyperlipidemia: Secondary | ICD-10-CM | POA: Diagnosis not present

## 2015-08-27 DIAGNOSIS — I4891 Unspecified atrial fibrillation: Secondary | ICD-10-CM | POA: Diagnosis not present

## 2015-08-27 DIAGNOSIS — I10 Essential (primary) hypertension: Secondary | ICD-10-CM | POA: Diagnosis not present

## 2015-08-27 DIAGNOSIS — E088 Diabetes mellitus due to underlying condition with unspecified complications: Secondary | ICD-10-CM | POA: Diagnosis not present

## 2015-08-27 DIAGNOSIS — D573 Sickle-cell trait: Secondary | ICD-10-CM | POA: Diagnosis not present

## 2015-08-28 DIAGNOSIS — R262 Difficulty in walking, not elsewhere classified: Secondary | ICD-10-CM | POA: Diagnosis not present

## 2015-08-28 DIAGNOSIS — I482 Chronic atrial fibrillation: Secondary | ICD-10-CM | POA: Diagnosis not present

## 2015-08-28 DIAGNOSIS — E1142 Type 2 diabetes mellitus with diabetic polyneuropathy: Secondary | ICD-10-CM | POA: Diagnosis not present

## 2015-08-28 DIAGNOSIS — I2510 Atherosclerotic heart disease of native coronary artery without angina pectoris: Secondary | ICD-10-CM | POA: Diagnosis not present

## 2015-08-28 DIAGNOSIS — D573 Sickle-cell trait: Secondary | ICD-10-CM | POA: Diagnosis not present

## 2015-08-28 DIAGNOSIS — I10 Essential (primary) hypertension: Secondary | ICD-10-CM | POA: Diagnosis not present

## 2015-09-02 DIAGNOSIS — R262 Difficulty in walking, not elsewhere classified: Secondary | ICD-10-CM | POA: Diagnosis not present

## 2015-09-02 DIAGNOSIS — I482 Chronic atrial fibrillation: Secondary | ICD-10-CM | POA: Diagnosis not present

## 2015-09-02 DIAGNOSIS — I2510 Atherosclerotic heart disease of native coronary artery without angina pectoris: Secondary | ICD-10-CM | POA: Diagnosis not present

## 2015-09-02 DIAGNOSIS — D573 Sickle-cell trait: Secondary | ICD-10-CM | POA: Diagnosis not present

## 2015-09-02 DIAGNOSIS — E1142 Type 2 diabetes mellitus with diabetic polyneuropathy: Secondary | ICD-10-CM | POA: Diagnosis not present

## 2015-09-02 DIAGNOSIS — I10 Essential (primary) hypertension: Secondary | ICD-10-CM | POA: Diagnosis not present

## 2015-09-04 DIAGNOSIS — R262 Difficulty in walking, not elsewhere classified: Secondary | ICD-10-CM | POA: Diagnosis not present

## 2015-09-04 DIAGNOSIS — E1142 Type 2 diabetes mellitus with diabetic polyneuropathy: Secondary | ICD-10-CM | POA: Diagnosis not present

## 2015-09-04 DIAGNOSIS — I482 Chronic atrial fibrillation: Secondary | ICD-10-CM | POA: Diagnosis not present

## 2015-09-04 DIAGNOSIS — I2510 Atherosclerotic heart disease of native coronary artery without angina pectoris: Secondary | ICD-10-CM | POA: Diagnosis not present

## 2015-09-04 DIAGNOSIS — D573 Sickle-cell trait: Secondary | ICD-10-CM | POA: Diagnosis not present

## 2015-09-04 DIAGNOSIS — I10 Essential (primary) hypertension: Secondary | ICD-10-CM | POA: Diagnosis not present

## 2015-09-08 ENCOUNTER — Ambulatory Visit (INDEPENDENT_AMBULATORY_CARE_PROVIDER_SITE_OTHER): Payer: Medicare Other | Admitting: Podiatry

## 2015-09-08 ENCOUNTER — Ambulatory Visit (INDEPENDENT_AMBULATORY_CARE_PROVIDER_SITE_OTHER): Payer: Medicare Other

## 2015-09-08 ENCOUNTER — Encounter: Payer: Self-pay | Admitting: Podiatry

## 2015-09-08 DIAGNOSIS — R52 Pain, unspecified: Secondary | ICD-10-CM | POA: Diagnosis not present

## 2015-09-08 DIAGNOSIS — B351 Tinea unguium: Secondary | ICD-10-CM | POA: Diagnosis not present

## 2015-09-08 DIAGNOSIS — E1140 Type 2 diabetes mellitus with diabetic neuropathy, unspecified: Secondary | ICD-10-CM

## 2015-09-08 DIAGNOSIS — M79676 Pain in unspecified toe(s): Secondary | ICD-10-CM

## 2015-09-08 DIAGNOSIS — M779 Enthesopathy, unspecified: Secondary | ICD-10-CM

## 2015-09-08 NOTE — Progress Notes (Signed)
Subjective:     Patient ID: Bruce Price, male   DOB: 1941-09-11, 74 y.o.   MRN: [id number]  HPI this patient presents to my office for continued preventative foot care services. He says his nails have grown thick and long are painful when walking and wearing shoes. He is diabetic. He presents for preventative foot care services. He has a skin lesion noted on the fourth toe of the left foot. There is no drainage, swelling or infection noted. He says this skin lesion was bleeding, but is now getting better. He presents the office for an evaluation and treatment of his feet. He also relates having significant swelling noted in the left foot greater than the right foot   Review of Systems     Objective:   Physical Exam.Podiatric Exam: Vascular: dorsalis pedis and posterior tibial pulses are non-palpable due to swelling in his feet.. Capillary return is diminished. Cold feet noted.. Skin turgor WNL, bilateral swelling  Sensorium: Diminished Semmes Weinstein monofilament test. Normal tactile sensation bilaterally.  Nail Exam: Pt has thick disfigured discolored nails with subungual debris noted bilateral entire nail hallux through fifth toenails except big toenail right foot. He has no evidence of redness or infection from avulsed nail. Ulcer Exam: There is no evidence of ulcer or pre-ulcerative changes or infection. Orthopedic Exam: Muscle tone and strength are WNL. No limitations in general ROM. No crepitus or effusions noted. Foot type and digits show no abnormalities. Bony prominences are unremarkable. Significant swelling left foot greater than right foot. Skin: No Porokeratosis. No infection or ulcer. Distal pterygium multiple nails both feet. There is healing ulcerated area on dorsum of fourth toe left foot.     Assessment:     Onychomycosis  B/L  Diabetic.     Plan:     Debridement and grinding of long thick nails.  X-ray was taken left foot revealing dislocation talo-navicular  joint left foot.    Helane Gunther DPM

## 2015-09-10 DIAGNOSIS — R262 Difficulty in walking, not elsewhere classified: Secondary | ICD-10-CM | POA: Diagnosis not present

## 2015-09-10 DIAGNOSIS — E1142 Type 2 diabetes mellitus with diabetic polyneuropathy: Secondary | ICD-10-CM | POA: Diagnosis not present

## 2015-09-10 DIAGNOSIS — I10 Essential (primary) hypertension: Secondary | ICD-10-CM | POA: Diagnosis not present

## 2015-09-10 DIAGNOSIS — I482 Chronic atrial fibrillation: Secondary | ICD-10-CM | POA: Diagnosis not present

## 2015-09-10 DIAGNOSIS — I2510 Atherosclerotic heart disease of native coronary artery without angina pectoris: Secondary | ICD-10-CM | POA: Diagnosis not present

## 2015-09-10 DIAGNOSIS — D573 Sickle-cell trait: Secondary | ICD-10-CM | POA: Diagnosis not present

## 2015-09-12 DIAGNOSIS — E1142 Type 2 diabetes mellitus with diabetic polyneuropathy: Secondary | ICD-10-CM | POA: Diagnosis not present

## 2015-09-12 DIAGNOSIS — I2510 Atherosclerotic heart disease of native coronary artery without angina pectoris: Secondary | ICD-10-CM | POA: Diagnosis not present

## 2015-09-12 DIAGNOSIS — D573 Sickle-cell trait: Secondary | ICD-10-CM | POA: Diagnosis not present

## 2015-09-12 DIAGNOSIS — I10 Essential (primary) hypertension: Secondary | ICD-10-CM | POA: Diagnosis not present

## 2015-09-12 DIAGNOSIS — I482 Chronic atrial fibrillation: Secondary | ICD-10-CM | POA: Diagnosis not present

## 2015-09-12 DIAGNOSIS — R262 Difficulty in walking, not elsewhere classified: Secondary | ICD-10-CM | POA: Diagnosis not present

## 2015-09-16 DIAGNOSIS — D573 Sickle-cell trait: Secondary | ICD-10-CM | POA: Diagnosis not present

## 2015-09-16 DIAGNOSIS — I482 Chronic atrial fibrillation: Secondary | ICD-10-CM | POA: Diagnosis not present

## 2015-09-16 DIAGNOSIS — R262 Difficulty in walking, not elsewhere classified: Secondary | ICD-10-CM | POA: Diagnosis not present

## 2015-09-16 DIAGNOSIS — I10 Essential (primary) hypertension: Secondary | ICD-10-CM | POA: Diagnosis not present

## 2015-09-16 DIAGNOSIS — I2510 Atherosclerotic heart disease of native coronary artery without angina pectoris: Secondary | ICD-10-CM | POA: Diagnosis not present

## 2015-09-16 DIAGNOSIS — E1142 Type 2 diabetes mellitus with diabetic polyneuropathy: Secondary | ICD-10-CM | POA: Diagnosis not present

## 2015-09-18 DIAGNOSIS — I482 Chronic atrial fibrillation: Secondary | ICD-10-CM | POA: Diagnosis not present

## 2015-09-18 DIAGNOSIS — R262 Difficulty in walking, not elsewhere classified: Secondary | ICD-10-CM | POA: Diagnosis not present

## 2015-09-18 DIAGNOSIS — D573 Sickle-cell trait: Secondary | ICD-10-CM | POA: Diagnosis not present

## 2015-09-18 DIAGNOSIS — I2510 Atherosclerotic heart disease of native coronary artery without angina pectoris: Secondary | ICD-10-CM | POA: Diagnosis not present

## 2015-09-18 DIAGNOSIS — E1142 Type 2 diabetes mellitus with diabetic polyneuropathy: Secondary | ICD-10-CM | POA: Diagnosis not present

## 2015-09-18 DIAGNOSIS — I10 Essential (primary) hypertension: Secondary | ICD-10-CM | POA: Diagnosis not present

## 2015-09-19 DIAGNOSIS — I4891 Unspecified atrial fibrillation: Secondary | ICD-10-CM | POA: Diagnosis not present

## 2015-10-13 DIAGNOSIS — I4891 Unspecified atrial fibrillation: Secondary | ICD-10-CM | POA: Diagnosis not present

## 2015-11-12 DIAGNOSIS — I4891 Unspecified atrial fibrillation: Secondary | ICD-10-CM | POA: Diagnosis not present

## 2015-12-10 DIAGNOSIS — I4891 Unspecified atrial fibrillation: Secondary | ICD-10-CM | POA: Diagnosis not present

## 2015-12-30 DIAGNOSIS — I87332 Chronic venous hypertension (idiopathic) with ulcer and inflammation of left lower extremity: Secondary | ICD-10-CM | POA: Diagnosis not present

## 2015-12-30 DIAGNOSIS — S81801A Unspecified open wound, right lower leg, initial encounter: Secondary | ICD-10-CM | POA: Diagnosis not present

## 2015-12-30 DIAGNOSIS — S81802A Unspecified open wound, left lower leg, initial encounter: Secondary | ICD-10-CM | POA: Diagnosis not present

## 2015-12-30 DIAGNOSIS — M7989 Other specified soft tissue disorders: Secondary | ICD-10-CM | POA: Diagnosis not present

## 2015-12-30 DIAGNOSIS — E1165 Type 2 diabetes mellitus with hyperglycemia: Secondary | ICD-10-CM | POA: Diagnosis not present

## 2016-01-01 DIAGNOSIS — M109 Gout, unspecified: Secondary | ICD-10-CM | POA: Diagnosis not present

## 2016-01-01 DIAGNOSIS — E119 Type 2 diabetes mellitus without complications: Secondary | ICD-10-CM | POA: Diagnosis not present

## 2016-01-01 DIAGNOSIS — M1990 Unspecified osteoarthritis, unspecified site: Secondary | ICD-10-CM | POA: Diagnosis not present

## 2016-01-01 DIAGNOSIS — N400 Enlarged prostate without lower urinary tract symptoms: Secondary | ICD-10-CM | POA: Diagnosis not present

## 2016-01-01 DIAGNOSIS — I509 Heart failure, unspecified: Secondary | ICD-10-CM | POA: Diagnosis not present

## 2016-01-01 DIAGNOSIS — L97821 Non-pressure chronic ulcer of other part of left lower leg limited to breakdown of skin: Secondary | ICD-10-CM | POA: Diagnosis not present

## 2016-01-01 DIAGNOSIS — E11622 Type 2 diabetes mellitus with other skin ulcer: Secondary | ICD-10-CM | POA: Diagnosis not present

## 2016-01-01 DIAGNOSIS — I4891 Unspecified atrial fibrillation: Secondary | ICD-10-CM | POA: Diagnosis not present

## 2016-01-01 DIAGNOSIS — L97811 Non-pressure chronic ulcer of other part of right lower leg limited to breakdown of skin: Secondary | ICD-10-CM | POA: Diagnosis not present

## 2016-01-01 DIAGNOSIS — I110 Hypertensive heart disease with heart failure: Secondary | ICD-10-CM | POA: Diagnosis not present

## 2016-01-01 DIAGNOSIS — L97212 Non-pressure chronic ulcer of right calf with fat layer exposed: Secondary | ICD-10-CM | POA: Diagnosis not present

## 2016-01-01 DIAGNOSIS — L97222 Non-pressure chronic ulcer of left calf with fat layer exposed: Secondary | ICD-10-CM | POA: Diagnosis not present

## 2016-01-01 DIAGNOSIS — Z794 Long term (current) use of insulin: Secondary | ICD-10-CM | POA: Diagnosis not present

## 2016-01-01 DIAGNOSIS — G4730 Sleep apnea, unspecified: Secondary | ICD-10-CM | POA: Diagnosis not present

## 2016-01-01 DIAGNOSIS — Z87891 Personal history of nicotine dependence: Secondary | ICD-10-CM | POA: Diagnosis not present

## 2016-01-01 DIAGNOSIS — E785 Hyperlipidemia, unspecified: Secondary | ICD-10-CM | POA: Diagnosis not present

## 2016-01-01 DIAGNOSIS — I872 Venous insufficiency (chronic) (peripheral): Secondary | ICD-10-CM | POA: Diagnosis not present

## 2016-01-05 DIAGNOSIS — H26493 Other secondary cataract, bilateral: Secondary | ICD-10-CM | POA: Diagnosis not present

## 2016-01-05 DIAGNOSIS — E119 Type 2 diabetes mellitus without complications: Secondary | ICD-10-CM | POA: Diagnosis not present

## 2016-01-06 DIAGNOSIS — Z7901 Long term (current) use of anticoagulants: Secondary | ICD-10-CM | POA: Diagnosis not present

## 2016-01-06 DIAGNOSIS — E1165 Type 2 diabetes mellitus with hyperglycemia: Secondary | ICD-10-CM | POA: Diagnosis not present

## 2016-01-06 DIAGNOSIS — N400 Enlarged prostate without lower urinary tract symptoms: Secondary | ICD-10-CM | POA: Diagnosis not present

## 2016-01-06 DIAGNOSIS — Z794 Long term (current) use of insulin: Secondary | ICD-10-CM | POA: Diagnosis not present

## 2016-01-06 DIAGNOSIS — L97212 Non-pressure chronic ulcer of right calf with fat layer exposed: Secondary | ICD-10-CM | POA: Diagnosis not present

## 2016-01-06 DIAGNOSIS — E11622 Type 2 diabetes mellitus with other skin ulcer: Secondary | ICD-10-CM | POA: Diagnosis not present

## 2016-01-06 DIAGNOSIS — I87333 Chronic venous hypertension (idiopathic) with ulcer and inflammation of bilateral lower extremity: Secondary | ICD-10-CM | POA: Diagnosis not present

## 2016-01-06 DIAGNOSIS — I10 Essential (primary) hypertension: Secondary | ICD-10-CM | POA: Diagnosis not present

## 2016-01-06 DIAGNOSIS — Z6832 Body mass index (BMI) 32.0-32.9, adult: Secondary | ICD-10-CM | POA: Diagnosis not present

## 2016-01-06 DIAGNOSIS — L97222 Non-pressure chronic ulcer of left calf with fat layer exposed: Secondary | ICD-10-CM | POA: Diagnosis not present

## 2016-01-06 DIAGNOSIS — I4891 Unspecified atrial fibrillation: Secondary | ICD-10-CM | POA: Diagnosis not present

## 2016-01-08 DIAGNOSIS — I872 Venous insufficiency (chronic) (peripheral): Secondary | ICD-10-CM | POA: Diagnosis not present

## 2016-01-08 DIAGNOSIS — L97811 Non-pressure chronic ulcer of other part of right lower leg limited to breakdown of skin: Secondary | ICD-10-CM | POA: Diagnosis not present

## 2016-01-08 DIAGNOSIS — E119 Type 2 diabetes mellitus without complications: Secondary | ICD-10-CM | POA: Diagnosis not present

## 2016-01-12 DIAGNOSIS — E1165 Type 2 diabetes mellitus with hyperglycemia: Secondary | ICD-10-CM | POA: Diagnosis not present

## 2016-01-12 DIAGNOSIS — E11622 Type 2 diabetes mellitus with other skin ulcer: Secondary | ICD-10-CM | POA: Diagnosis not present

## 2016-01-12 DIAGNOSIS — I4891 Unspecified atrial fibrillation: Secondary | ICD-10-CM | POA: Diagnosis not present

## 2016-01-12 DIAGNOSIS — I87333 Chronic venous hypertension (idiopathic) with ulcer and inflammation of bilateral lower extremity: Secondary | ICD-10-CM | POA: Diagnosis not present

## 2016-01-12 DIAGNOSIS — L97222 Non-pressure chronic ulcer of left calf with fat layer exposed: Secondary | ICD-10-CM | POA: Diagnosis not present

## 2016-01-12 DIAGNOSIS — L97212 Non-pressure chronic ulcer of right calf with fat layer exposed: Secondary | ICD-10-CM | POA: Diagnosis not present

## 2016-01-15 DIAGNOSIS — E11622 Type 2 diabetes mellitus with other skin ulcer: Secondary | ICD-10-CM | POA: Diagnosis not present

## 2016-01-15 DIAGNOSIS — I743 Embolism and thrombosis of arteries of the lower extremities: Secondary | ICD-10-CM | POA: Diagnosis not present

## 2016-01-19 DIAGNOSIS — L97212 Non-pressure chronic ulcer of right calf with fat layer exposed: Secondary | ICD-10-CM | POA: Diagnosis not present

## 2016-01-19 DIAGNOSIS — I4891 Unspecified atrial fibrillation: Secondary | ICD-10-CM | POA: Diagnosis not present

## 2016-01-19 DIAGNOSIS — E1165 Type 2 diabetes mellitus with hyperglycemia: Secondary | ICD-10-CM | POA: Diagnosis not present

## 2016-01-19 DIAGNOSIS — I87333 Chronic venous hypertension (idiopathic) with ulcer and inflammation of bilateral lower extremity: Secondary | ICD-10-CM | POA: Diagnosis not present

## 2016-01-19 DIAGNOSIS — L97222 Non-pressure chronic ulcer of left calf with fat layer exposed: Secondary | ICD-10-CM | POA: Diagnosis not present

## 2016-01-19 DIAGNOSIS — E11622 Type 2 diabetes mellitus with other skin ulcer: Secondary | ICD-10-CM | POA: Diagnosis not present

## 2016-01-26 DIAGNOSIS — L97212 Non-pressure chronic ulcer of right calf with fat layer exposed: Secondary | ICD-10-CM | POA: Diagnosis not present

## 2016-01-26 DIAGNOSIS — I87333 Chronic venous hypertension (idiopathic) with ulcer and inflammation of bilateral lower extremity: Secondary | ICD-10-CM | POA: Diagnosis not present

## 2016-01-26 DIAGNOSIS — L97222 Non-pressure chronic ulcer of left calf with fat layer exposed: Secondary | ICD-10-CM | POA: Diagnosis not present

## 2016-01-26 DIAGNOSIS — I4891 Unspecified atrial fibrillation: Secondary | ICD-10-CM | POA: Diagnosis not present

## 2016-01-26 DIAGNOSIS — E1165 Type 2 diabetes mellitus with hyperglycemia: Secondary | ICD-10-CM | POA: Diagnosis not present

## 2016-01-26 DIAGNOSIS — E11622 Type 2 diabetes mellitus with other skin ulcer: Secondary | ICD-10-CM | POA: Diagnosis not present

## 2016-01-28 DIAGNOSIS — L97909 Non-pressure chronic ulcer of unspecified part of unspecified lower leg with unspecified severity: Secondary | ICD-10-CM | POA: Diagnosis not present

## 2016-01-28 DIAGNOSIS — I83009 Varicose veins of unspecified lower extremity with ulcer of unspecified site: Secondary | ICD-10-CM | POA: Diagnosis not present

## 2016-02-02 DIAGNOSIS — L97222 Non-pressure chronic ulcer of left calf with fat layer exposed: Secondary | ICD-10-CM | POA: Diagnosis not present

## 2016-02-02 DIAGNOSIS — E11622 Type 2 diabetes mellitus with other skin ulcer: Secondary | ICD-10-CM | POA: Diagnosis not present

## 2016-02-02 DIAGNOSIS — I87333 Chronic venous hypertension (idiopathic) with ulcer and inflammation of bilateral lower extremity: Secondary | ICD-10-CM | POA: Diagnosis not present

## 2016-02-02 DIAGNOSIS — I4891 Unspecified atrial fibrillation: Secondary | ICD-10-CM | POA: Diagnosis not present

## 2016-02-02 DIAGNOSIS — E1165 Type 2 diabetes mellitus with hyperglycemia: Secondary | ICD-10-CM | POA: Diagnosis not present

## 2016-02-02 DIAGNOSIS — L97212 Non-pressure chronic ulcer of right calf with fat layer exposed: Secondary | ICD-10-CM | POA: Diagnosis not present

## 2016-02-05 DIAGNOSIS — T1490 Injury, unspecified: Secondary | ICD-10-CM | POA: Diagnosis not present

## 2016-02-05 DIAGNOSIS — E162 Hypoglycemia, unspecified: Secondary | ICD-10-CM | POA: Diagnosis not present

## 2016-02-05 DIAGNOSIS — E161 Other hypoglycemia: Secondary | ICD-10-CM | POA: Diagnosis not present

## 2016-02-09 DIAGNOSIS — I4891 Unspecified atrial fibrillation: Secondary | ICD-10-CM | POA: Diagnosis not present

## 2016-02-09 DIAGNOSIS — E11622 Type 2 diabetes mellitus with other skin ulcer: Secondary | ICD-10-CM | POA: Diagnosis not present

## 2016-02-09 DIAGNOSIS — E1165 Type 2 diabetes mellitus with hyperglycemia: Secondary | ICD-10-CM | POA: Diagnosis not present

## 2016-02-09 DIAGNOSIS — I87333 Chronic venous hypertension (idiopathic) with ulcer and inflammation of bilateral lower extremity: Secondary | ICD-10-CM | POA: Diagnosis not present

## 2016-02-09 DIAGNOSIS — L97212 Non-pressure chronic ulcer of right calf with fat layer exposed: Secondary | ICD-10-CM | POA: Diagnosis not present

## 2016-02-09 DIAGNOSIS — L97222 Non-pressure chronic ulcer of left calf with fat layer exposed: Secondary | ICD-10-CM | POA: Diagnosis not present

## 2016-02-17 DIAGNOSIS — I87333 Chronic venous hypertension (idiopathic) with ulcer and inflammation of bilateral lower extremity: Secondary | ICD-10-CM | POA: Diagnosis not present

## 2016-02-17 DIAGNOSIS — I4891 Unspecified atrial fibrillation: Secondary | ICD-10-CM | POA: Diagnosis not present

## 2016-02-17 DIAGNOSIS — E11622 Type 2 diabetes mellitus with other skin ulcer: Secondary | ICD-10-CM | POA: Diagnosis not present

## 2016-02-17 DIAGNOSIS — L97222 Non-pressure chronic ulcer of left calf with fat layer exposed: Secondary | ICD-10-CM | POA: Diagnosis not present

## 2016-02-17 DIAGNOSIS — E1165 Type 2 diabetes mellitus with hyperglycemia: Secondary | ICD-10-CM | POA: Diagnosis not present

## 2016-02-17 DIAGNOSIS — L97212 Non-pressure chronic ulcer of right calf with fat layer exposed: Secondary | ICD-10-CM | POA: Diagnosis not present

## 2016-04-22 DIAGNOSIS — I4891 Unspecified atrial fibrillation: Secondary | ICD-10-CM | POA: Diagnosis not present

## 2016-05-03 DIAGNOSIS — M7989 Other specified soft tissue disorders: Secondary | ICD-10-CM

## 2016-05-03 DIAGNOSIS — I83029 Varicose veins of left lower extremity with ulcer of unspecified site: Secondary | ICD-10-CM

## 2016-05-03 DIAGNOSIS — E669 Obesity, unspecified: Secondary | ICD-10-CM | POA: Diagnosis present

## 2016-05-03 DIAGNOSIS — E11649 Type 2 diabetes mellitus with hypoglycemia without coma: Secondary | ICD-10-CM | POA: Diagnosis present

## 2016-05-03 DIAGNOSIS — L97929 Non-pressure chronic ulcer of unspecified part of left lower leg with unspecified severity: Secondary | ICD-10-CM | POA: Diagnosis not present

## 2016-05-03 DIAGNOSIS — Z96649 Presence of unspecified artificial hip joint: Secondary | ICD-10-CM | POA: Diagnosis present

## 2016-05-03 DIAGNOSIS — Z79899 Other long term (current) drug therapy: Secondary | ICD-10-CM | POA: Diagnosis not present

## 2016-05-03 DIAGNOSIS — E11621 Type 2 diabetes mellitus with foot ulcer: Secondary | ICD-10-CM | POA: Diagnosis present

## 2016-05-03 DIAGNOSIS — R0609 Other forms of dyspnea: Secondary | ICD-10-CM | POA: Diagnosis not present

## 2016-05-03 DIAGNOSIS — Z7901 Long term (current) use of anticoagulants: Secondary | ICD-10-CM | POA: Diagnosis not present

## 2016-05-03 DIAGNOSIS — L97829 Non-pressure chronic ulcer of other part of left lower leg with unspecified severity: Secondary | ICD-10-CM | POA: Diagnosis present

## 2016-05-03 DIAGNOSIS — E784 Other hyperlipidemia: Secondary | ICD-10-CM | POA: Diagnosis not present

## 2016-05-03 DIAGNOSIS — E785 Hyperlipidemia, unspecified: Secondary | ICD-10-CM | POA: Diagnosis not present

## 2016-05-03 DIAGNOSIS — I482 Chronic atrial fibrillation: Secondary | ICD-10-CM | POA: Diagnosis not present

## 2016-05-03 DIAGNOSIS — I4892 Unspecified atrial flutter: Secondary | ICD-10-CM | POA: Diagnosis not present

## 2016-05-03 DIAGNOSIS — I517 Cardiomegaly: Secondary | ICD-10-CM | POA: Diagnosis not present

## 2016-05-03 DIAGNOSIS — I4510 Unspecified right bundle-branch block: Secondary | ICD-10-CM | POA: Diagnosis not present

## 2016-05-03 DIAGNOSIS — Z6830 Body mass index (BMI) 30.0-30.9, adult: Secondary | ICD-10-CM | POA: Diagnosis not present

## 2016-05-03 DIAGNOSIS — I5041 Acute combined systolic (congestive) and diastolic (congestive) heart failure: Secondary | ICD-10-CM | POA: Diagnosis not present

## 2016-05-03 DIAGNOSIS — I472 Ventricular tachycardia: Secondary | ICD-10-CM | POA: Diagnosis not present

## 2016-05-03 DIAGNOSIS — L97529 Non-pressure chronic ulcer of other part of left foot with unspecified severity: Secondary | ICD-10-CM | POA: Diagnosis present

## 2016-05-03 DIAGNOSIS — I420 Dilated cardiomyopathy: Secondary | ICD-10-CM | POA: Diagnosis not present

## 2016-05-03 DIAGNOSIS — Z794 Long term (current) use of insulin: Secondary | ICD-10-CM | POA: Diagnosis not present

## 2016-05-03 DIAGNOSIS — I255 Ischemic cardiomyopathy: Secondary | ICD-10-CM | POA: Diagnosis not present

## 2016-05-03 DIAGNOSIS — D573 Sickle-cell trait: Secondary | ICD-10-CM | POA: Diagnosis not present

## 2016-05-03 DIAGNOSIS — I5021 Acute systolic (congestive) heart failure: Secondary | ICD-10-CM | POA: Diagnosis not present

## 2016-05-03 DIAGNOSIS — I872 Venous insufficiency (chronic) (peripheral): Secondary | ICD-10-CM

## 2016-05-03 DIAGNOSIS — R001 Bradycardia, unspecified: Secondary | ICD-10-CM | POA: Diagnosis not present

## 2016-05-03 DIAGNOSIS — I495 Sick sinus syndrome: Secondary | ICD-10-CM | POA: Diagnosis not present

## 2016-05-03 DIAGNOSIS — I5042 Chronic combined systolic (congestive) and diastolic (congestive) heart failure: Secondary | ICD-10-CM | POA: Diagnosis not present

## 2016-05-03 DIAGNOSIS — E1169 Type 2 diabetes mellitus with other specified complication: Secondary | ICD-10-CM

## 2016-05-03 DIAGNOSIS — R079 Chest pain, unspecified: Secondary | ICD-10-CM | POA: Diagnosis not present

## 2016-05-03 DIAGNOSIS — I110 Hypertensive heart disease with heart failure: Secondary | ICD-10-CM | POA: Diagnosis not present

## 2016-05-03 DIAGNOSIS — Z87891 Personal history of nicotine dependence: Secondary | ICD-10-CM | POA: Diagnosis not present

## 2016-05-03 DIAGNOSIS — I10 Essential (primary) hypertension: Secondary | ICD-10-CM | POA: Diagnosis not present

## 2016-05-03 DIAGNOSIS — E119 Type 2 diabetes mellitus without complications: Secondary | ICD-10-CM | POA: Diagnosis not present

## 2016-05-03 DIAGNOSIS — I2510 Atherosclerotic heart disease of native coronary artery without angina pectoris: Secondary | ICD-10-CM | POA: Diagnosis not present

## 2016-05-03 DIAGNOSIS — I480 Paroxysmal atrial fibrillation: Secondary | ICD-10-CM | POA: Diagnosis not present

## 2016-05-03 DIAGNOSIS — M109 Gout, unspecified: Secondary | ICD-10-CM | POA: Diagnosis present

## 2016-05-03 DIAGNOSIS — I509 Heart failure, unspecified: Secondary | ICD-10-CM | POA: Diagnosis not present

## 2016-05-03 DIAGNOSIS — M1990 Unspecified osteoarthritis, unspecified site: Secondary | ICD-10-CM | POA: Diagnosis present

## 2016-05-03 DIAGNOSIS — I5043 Acute on chronic combined systolic (congestive) and diastolic (congestive) heart failure: Secondary | ICD-10-CM | POA: Diagnosis not present

## 2016-05-03 DIAGNOSIS — I4891 Unspecified atrial fibrillation: Secondary | ICD-10-CM | POA: Diagnosis not present

## 2016-05-03 DIAGNOSIS — I83228 Varicose veins of left lower extremity with both ulcer of other part of lower extremity and inflammation: Secondary | ICD-10-CM | POA: Diagnosis present

## 2016-05-05 DIAGNOSIS — I4891 Unspecified atrial fibrillation: Secondary | ICD-10-CM

## 2016-05-05 DIAGNOSIS — I255 Ischemic cardiomyopathy: Secondary | ICD-10-CM

## 2016-05-10 DIAGNOSIS — I420 Dilated cardiomyopathy: Secondary | ICD-10-CM

## 2016-05-11 DIAGNOSIS — E119 Type 2 diabetes mellitus without complications: Secondary | ICD-10-CM | POA: Diagnosis not present

## 2016-05-11 DIAGNOSIS — Z7901 Long term (current) use of anticoagulants: Secondary | ICD-10-CM | POA: Diagnosis not present

## 2016-05-11 DIAGNOSIS — I4892 Unspecified atrial flutter: Secondary | ICD-10-CM | POA: Diagnosis not present

## 2016-05-11 DIAGNOSIS — I110 Hypertensive heart disease with heart failure: Secondary | ICD-10-CM | POA: Diagnosis not present

## 2016-05-11 DIAGNOSIS — I4510 Unspecified right bundle-branch block: Secondary | ICD-10-CM | POA: Diagnosis not present

## 2016-05-11 DIAGNOSIS — I495 Sick sinus syndrome: Secondary | ICD-10-CM | POA: Diagnosis not present

## 2016-05-11 DIAGNOSIS — Z79899 Other long term (current) drug therapy: Secondary | ICD-10-CM | POA: Diagnosis not present

## 2016-05-11 DIAGNOSIS — E784 Other hyperlipidemia: Secondary | ICD-10-CM | POA: Diagnosis not present

## 2016-05-11 DIAGNOSIS — I482 Chronic atrial fibrillation: Secondary | ICD-10-CM | POA: Diagnosis not present

## 2016-05-11 DIAGNOSIS — I509 Heart failure, unspecified: Secondary | ICD-10-CM | POA: Diagnosis not present

## 2016-05-11 DIAGNOSIS — I420 Dilated cardiomyopathy: Secondary | ICD-10-CM | POA: Diagnosis not present

## 2016-05-11 DIAGNOSIS — I4891 Unspecified atrial fibrillation: Secondary | ICD-10-CM | POA: Diagnosis not present

## 2016-05-12 DIAGNOSIS — R001 Bradycardia, unspecified: Secondary | ICD-10-CM | POA: Diagnosis not present

## 2016-05-12 DIAGNOSIS — I495 Sick sinus syndrome: Secondary | ICD-10-CM | POA: Diagnosis not present

## 2016-05-12 DIAGNOSIS — Z79899 Other long term (current) drug therapy: Secondary | ICD-10-CM | POA: Diagnosis not present

## 2016-05-12 DIAGNOSIS — I509 Heart failure, unspecified: Secondary | ICD-10-CM | POA: Diagnosis not present

## 2016-05-12 DIAGNOSIS — I420 Dilated cardiomyopathy: Secondary | ICD-10-CM | POA: Diagnosis not present

## 2016-05-12 DIAGNOSIS — I4892 Unspecified atrial flutter: Secondary | ICD-10-CM | POA: Diagnosis not present

## 2016-05-12 DIAGNOSIS — I110 Hypertensive heart disease with heart failure: Secondary | ICD-10-CM | POA: Diagnosis not present

## 2016-05-12 DIAGNOSIS — Z452 Encounter for adjustment and management of vascular access device: Secondary | ICD-10-CM | POA: Diagnosis not present

## 2016-05-12 DIAGNOSIS — I501 Left ventricular failure: Secondary | ICD-10-CM | POA: Diagnosis not present

## 2016-05-13 DIAGNOSIS — I420 Dilated cardiomyopathy: Secondary | ICD-10-CM | POA: Diagnosis not present

## 2016-05-13 DIAGNOSIS — Z79899 Other long term (current) drug therapy: Secondary | ICD-10-CM | POA: Diagnosis not present

## 2016-05-13 DIAGNOSIS — I495 Sick sinus syndrome: Secondary | ICD-10-CM | POA: Diagnosis not present

## 2016-05-13 DIAGNOSIS — I10 Essential (primary) hypertension: Secondary | ICD-10-CM | POA: Diagnosis not present

## 2016-05-13 DIAGNOSIS — I4892 Unspecified atrial flutter: Secondary | ICD-10-CM | POA: Diagnosis not present

## 2016-05-13 DIAGNOSIS — I509 Heart failure, unspecified: Secondary | ICD-10-CM | POA: Diagnosis not present

## 2016-05-13 DIAGNOSIS — E119 Type 2 diabetes mellitus without complications: Secondary | ICD-10-CM | POA: Diagnosis not present

## 2016-05-13 DIAGNOSIS — I110 Hypertensive heart disease with heart failure: Secondary | ICD-10-CM | POA: Diagnosis not present

## 2016-05-13 DIAGNOSIS — Z95810 Presence of automatic (implantable) cardiac defibrillator: Secondary | ICD-10-CM | POA: Diagnosis not present

## 2016-05-13 DIAGNOSIS — R001 Bradycardia, unspecified: Secondary | ICD-10-CM | POA: Diagnosis not present

## 2016-05-15 DIAGNOSIS — M25569 Pain in unspecified knee: Secondary | ICD-10-CM | POA: Diagnosis not present

## 2016-05-15 DIAGNOSIS — M25461 Effusion, right knee: Secondary | ICD-10-CM | POA: Diagnosis not present

## 2016-05-15 DIAGNOSIS — M25562 Pain in left knee: Secondary | ICD-10-CM | POA: Diagnosis not present

## 2016-05-15 DIAGNOSIS — R5381 Other malaise: Secondary | ICD-10-CM | POA: Diagnosis not present

## 2016-05-15 DIAGNOSIS — M25561 Pain in right knee: Secondary | ICD-10-CM | POA: Diagnosis not present

## 2016-05-15 DIAGNOSIS — M25462 Effusion, left knee: Secondary | ICD-10-CM | POA: Diagnosis not present

## 2016-05-16 DIAGNOSIS — L03116 Cellulitis of left lower limb: Secondary | ICD-10-CM | POA: Diagnosis not present

## 2016-05-16 DIAGNOSIS — R531 Weakness: Secondary | ICD-10-CM | POA: Diagnosis not present

## 2016-05-16 DIAGNOSIS — R100 Acute abdomen: Secondary | ICD-10-CM | POA: Diagnosis not present

## 2016-05-16 DIAGNOSIS — M549 Dorsalgia, unspecified: Secondary | ICD-10-CM | POA: Diagnosis not present

## 2016-05-16 DIAGNOSIS — G894 Chronic pain syndrome: Secondary | ICD-10-CM | POA: Diagnosis not present

## 2016-05-16 DIAGNOSIS — M545 Low back pain: Secondary | ICD-10-CM | POA: Diagnosis not present

## 2016-05-16 DIAGNOSIS — S91302A Unspecified open wound, left foot, initial encounter: Secondary | ICD-10-CM | POA: Diagnosis not present

## 2016-05-17 DIAGNOSIS — Z7901 Long term (current) use of anticoagulants: Secondary | ICD-10-CM | POA: Diagnosis not present

## 2016-05-17 DIAGNOSIS — R600 Localized edema: Secondary | ICD-10-CM | POA: Diagnosis not present

## 2016-05-17 DIAGNOSIS — Z950 Presence of cardiac pacemaker: Secondary | ICD-10-CM | POA: Diagnosis not present

## 2016-05-17 DIAGNOSIS — E785 Hyperlipidemia, unspecified: Secondary | ICD-10-CM | POA: Diagnosis not present

## 2016-05-17 DIAGNOSIS — R079 Chest pain, unspecified: Secondary | ICD-10-CM | POA: Diagnosis not present

## 2016-05-17 DIAGNOSIS — R404 Transient alteration of awareness: Secondary | ICD-10-CM | POA: Diagnosis not present

## 2016-05-17 DIAGNOSIS — I6782 Cerebral ischemia: Secondary | ICD-10-CM | POA: Diagnosis not present

## 2016-05-17 DIAGNOSIS — I4891 Unspecified atrial fibrillation: Secondary | ICD-10-CM | POA: Diagnosis not present

## 2016-05-17 DIAGNOSIS — E1140 Type 2 diabetes mellitus with diabetic neuropathy, unspecified: Secondary | ICD-10-CM | POA: Diagnosis not present

## 2016-05-17 DIAGNOSIS — L97529 Non-pressure chronic ulcer of other part of left foot with unspecified severity: Secondary | ICD-10-CM | POA: Diagnosis not present

## 2016-05-17 DIAGNOSIS — M109 Gout, unspecified: Secondary | ICD-10-CM | POA: Diagnosis not present

## 2016-05-17 DIAGNOSIS — G8190 Hemiplegia, unspecified affecting unspecified side: Secondary | ICD-10-CM | POA: Diagnosis not present

## 2016-05-17 DIAGNOSIS — I509 Heart failure, unspecified: Secondary | ICD-10-CM | POA: Diagnosis not present

## 2016-05-17 DIAGNOSIS — Z79899 Other long term (current) drug therapy: Secondary | ICD-10-CM | POA: Diagnosis not present

## 2016-05-17 DIAGNOSIS — Z794 Long term (current) use of insulin: Secondary | ICD-10-CM | POA: Diagnosis not present

## 2016-05-17 DIAGNOSIS — I110 Hypertensive heart disease with heart failure: Secondary | ICD-10-CM | POA: Diagnosis not present

## 2016-05-17 DIAGNOSIS — R531 Weakness: Secondary | ICD-10-CM | POA: Diagnosis not present

## 2016-05-18 DIAGNOSIS — Z7901 Long term (current) use of anticoagulants: Secondary | ICD-10-CM | POA: Diagnosis not present

## 2016-05-18 DIAGNOSIS — E785 Hyperlipidemia, unspecified: Secondary | ICD-10-CM | POA: Diagnosis not present

## 2016-05-18 DIAGNOSIS — Z48812 Encounter for surgical aftercare following surgery on the circulatory system: Secondary | ICD-10-CM | POA: Diagnosis not present

## 2016-05-18 DIAGNOSIS — Z950 Presence of cardiac pacemaker: Secondary | ICD-10-CM | POA: Diagnosis not present

## 2016-05-18 DIAGNOSIS — Z741 Need for assistance with personal care: Secondary | ICD-10-CM | POA: Diagnosis not present

## 2016-05-18 DIAGNOSIS — E7800 Pure hypercholesterolemia, unspecified: Secondary | ICD-10-CM | POA: Diagnosis not present

## 2016-05-18 DIAGNOSIS — M6281 Muscle weakness (generalized): Secondary | ICD-10-CM | POA: Diagnosis not present

## 2016-05-18 DIAGNOSIS — I509 Heart failure, unspecified: Secondary | ICD-10-CM | POA: Diagnosis not present

## 2016-05-18 DIAGNOSIS — R262 Difficulty in walking, not elsewhere classified: Secondary | ICD-10-CM | POA: Diagnosis not present

## 2016-05-18 DIAGNOSIS — D573 Sickle-cell trait: Secondary | ICD-10-CM | POA: Diagnosis not present

## 2016-05-18 DIAGNOSIS — I10 Essential (primary) hypertension: Secondary | ICD-10-CM | POA: Diagnosis not present

## 2016-05-18 DIAGNOSIS — E162 Hypoglycemia, unspecified: Secondary | ICD-10-CM | POA: Diagnosis not present

## 2016-05-18 DIAGNOSIS — Z45018 Encounter for adjustment and management of other part of cardiac pacemaker: Secondary | ICD-10-CM | POA: Diagnosis not present

## 2016-05-18 DIAGNOSIS — L97529 Non-pressure chronic ulcer of other part of left foot with unspecified severity: Secondary | ICD-10-CM | POA: Diagnosis not present

## 2016-05-18 DIAGNOSIS — R531 Weakness: Secondary | ICD-10-CM | POA: Diagnosis not present

## 2016-05-18 DIAGNOSIS — E1140 Type 2 diabetes mellitus with diabetic neuropathy, unspecified: Secondary | ICD-10-CM | POA: Diagnosis not present

## 2016-05-18 DIAGNOSIS — Z794 Long term (current) use of insulin: Secondary | ICD-10-CM | POA: Diagnosis not present

## 2016-05-18 DIAGNOSIS — M159 Polyosteoarthritis, unspecified: Secondary | ICD-10-CM | POA: Diagnosis not present

## 2016-05-18 DIAGNOSIS — I110 Hypertensive heart disease with heart failure: Secondary | ICD-10-CM | POA: Diagnosis not present

## 2016-05-18 DIAGNOSIS — I4891 Unspecified atrial fibrillation: Secondary | ICD-10-CM | POA: Diagnosis not present

## 2016-05-18 DIAGNOSIS — M109 Gout, unspecified: Secondary | ICD-10-CM | POA: Diagnosis not present

## 2016-05-18 DIAGNOSIS — E161 Other hypoglycemia: Secondary | ICD-10-CM | POA: Diagnosis not present

## 2016-05-20 DIAGNOSIS — I4891 Unspecified atrial fibrillation: Secondary | ICD-10-CM | POA: Diagnosis not present

## 2016-05-25 DIAGNOSIS — I4891 Unspecified atrial fibrillation: Secondary | ICD-10-CM | POA: Diagnosis not present

## 2016-05-25 DIAGNOSIS — Z45018 Encounter for adjustment and management of other part of cardiac pacemaker: Secondary | ICD-10-CM | POA: Diagnosis not present

## 2016-05-28 DIAGNOSIS — E1140 Type 2 diabetes mellitus with diabetic neuropathy, unspecified: Secondary | ICD-10-CM | POA: Diagnosis not present

## 2016-05-28 DIAGNOSIS — I4891 Unspecified atrial fibrillation: Secondary | ICD-10-CM | POA: Diagnosis not present

## 2016-05-28 DIAGNOSIS — I509 Heart failure, unspecified: Secondary | ICD-10-CM | POA: Diagnosis not present

## 2016-05-29 DIAGNOSIS — Z7901 Long term (current) use of anticoagulants: Secondary | ICD-10-CM | POA: Diagnosis not present

## 2016-06-02 DIAGNOSIS — Z950 Presence of cardiac pacemaker: Secondary | ICD-10-CM | POA: Diagnosis not present

## 2016-06-02 DIAGNOSIS — E1140 Type 2 diabetes mellitus with diabetic neuropathy, unspecified: Secondary | ICD-10-CM | POA: Diagnosis not present

## 2016-06-02 DIAGNOSIS — I4891 Unspecified atrial fibrillation: Secondary | ICD-10-CM | POA: Diagnosis not present

## 2016-06-02 DIAGNOSIS — I509 Heart failure, unspecified: Secondary | ICD-10-CM | POA: Diagnosis not present

## 2016-07-07 DIAGNOSIS — R001 Bradycardia, unspecified: Secondary | ICD-10-CM | POA: Diagnosis not present

## 2016-07-07 DIAGNOSIS — I4891 Unspecified atrial fibrillation: Secondary | ICD-10-CM | POA: Diagnosis not present

## 2016-07-07 DIAGNOSIS — Z23 Encounter for immunization: Secondary | ICD-10-CM | POA: Diagnosis not present

## 2016-07-07 DIAGNOSIS — Z950 Presence of cardiac pacemaker: Secondary | ICD-10-CM | POA: Diagnosis not present

## 2016-07-07 DIAGNOSIS — Z6827 Body mass index (BMI) 27.0-27.9, adult: Secondary | ICD-10-CM | POA: Diagnosis not present

## 2016-07-14 DIAGNOSIS — I4891 Unspecified atrial fibrillation: Secondary | ICD-10-CM | POA: Diagnosis not present

## 2016-07-21 DIAGNOSIS — I4891 Unspecified atrial fibrillation: Secondary | ICD-10-CM | POA: Diagnosis not present

## 2016-07-28 DIAGNOSIS — I4891 Unspecified atrial fibrillation: Secondary | ICD-10-CM | POA: Diagnosis not present

## 2016-07-30 DIAGNOSIS — I4891 Unspecified atrial fibrillation: Secondary | ICD-10-CM | POA: Diagnosis not present

## 2016-08-04 DIAGNOSIS — I4891 Unspecified atrial fibrillation: Secondary | ICD-10-CM | POA: Diagnosis not present

## 2016-08-12 DIAGNOSIS — I4891 Unspecified atrial fibrillation: Secondary | ICD-10-CM | POA: Diagnosis not present

## 2016-08-31 DIAGNOSIS — Z950 Presence of cardiac pacemaker: Secondary | ICD-10-CM | POA: Diagnosis not present

## 2016-11-30 DIAGNOSIS — Z950 Presence of cardiac pacemaker: Secondary | ICD-10-CM | POA: Diagnosis not present

## 2016-12-18 DIAGNOSIS — I509 Heart failure, unspecified: Secondary | ICD-10-CM | POA: Diagnosis not present

## 2016-12-18 DIAGNOSIS — R0602 Shortness of breath: Secondary | ICD-10-CM | POA: Diagnosis not present

## 2016-12-18 DIAGNOSIS — R2240 Localized swelling, mass and lump, unspecified lower limb: Secondary | ICD-10-CM | POA: Diagnosis not present

## 2016-12-18 DIAGNOSIS — I110 Hypertensive heart disease with heart failure: Secondary | ICD-10-CM | POA: Diagnosis not present

## 2016-12-18 DIAGNOSIS — M7989 Other specified soft tissue disorders: Secondary | ICD-10-CM | POA: Diagnosis not present

## 2016-12-18 DIAGNOSIS — R000 Tachycardia, unspecified: Secondary | ICD-10-CM | POA: Diagnosis not present

## 2016-12-18 DIAGNOSIS — R05 Cough: Secondary | ICD-10-CM | POA: Diagnosis not present

## 2016-12-18 DIAGNOSIS — I471 Supraventricular tachycardia: Secondary | ICD-10-CM | POA: Diagnosis not present

## 2016-12-19 DIAGNOSIS — R0602 Shortness of breath: Secondary | ICD-10-CM | POA: Diagnosis not present

## 2016-12-19 DIAGNOSIS — Z7901 Long term (current) use of anticoagulants: Secondary | ICD-10-CM | POA: Diagnosis not present

## 2016-12-19 DIAGNOSIS — Z950 Presence of cardiac pacemaker: Secondary | ICD-10-CM | POA: Diagnosis not present

## 2016-12-19 DIAGNOSIS — I959 Hypotension, unspecified: Secondary | ICD-10-CM | POA: Diagnosis present

## 2016-12-19 DIAGNOSIS — I110 Hypertensive heart disease with heart failure: Secondary | ICD-10-CM | POA: Diagnosis present

## 2016-12-19 DIAGNOSIS — I5022 Chronic systolic (congestive) heart failure: Secondary | ICD-10-CM | POA: Diagnosis present

## 2016-12-19 DIAGNOSIS — E7800 Pure hypercholesterolemia, unspecified: Secondary | ICD-10-CM | POA: Diagnosis present

## 2016-12-19 DIAGNOSIS — I429 Cardiomyopathy, unspecified: Secondary | ICD-10-CM | POA: Diagnosis present

## 2016-12-19 DIAGNOSIS — E119 Type 2 diabetes mellitus without complications: Secondary | ICD-10-CM | POA: Diagnosis present

## 2016-12-19 DIAGNOSIS — M1990 Unspecified osteoarthritis, unspecified site: Secondary | ICD-10-CM | POA: Diagnosis present

## 2016-12-19 DIAGNOSIS — I878 Other specified disorders of veins: Secondary | ICD-10-CM | POA: Diagnosis present

## 2016-12-19 DIAGNOSIS — M109 Gout, unspecified: Secondary | ICD-10-CM | POA: Diagnosis present

## 2016-12-19 DIAGNOSIS — N179 Acute kidney failure, unspecified: Secondary | ICD-10-CM | POA: Diagnosis present

## 2016-12-19 DIAGNOSIS — Z79899 Other long term (current) drug therapy: Secondary | ICD-10-CM | POA: Diagnosis not present

## 2016-12-19 DIAGNOSIS — I4891 Unspecified atrial fibrillation: Secondary | ICD-10-CM | POA: Diagnosis present

## 2016-12-22 DIAGNOSIS — R001 Bradycardia, unspecified: Secondary | ICD-10-CM | POA: Diagnosis not present

## 2016-12-22 DIAGNOSIS — I5021 Acute systolic (congestive) heart failure: Secondary | ICD-10-CM | POA: Diagnosis not present

## 2016-12-22 DIAGNOSIS — I4891 Unspecified atrial fibrillation: Secondary | ICD-10-CM | POA: Diagnosis not present

## 2017-01-03 DIAGNOSIS — L97909 Non-pressure chronic ulcer of unspecified part of unspecified lower leg with unspecified severity: Secondary | ICD-10-CM | POA: Diagnosis not present

## 2017-01-03 DIAGNOSIS — I83009 Varicose veins of unspecified lower extremity with ulcer of unspecified site: Secondary | ICD-10-CM | POA: Diagnosis not present

## 2017-01-03 DIAGNOSIS — I4891 Unspecified atrial fibrillation: Secondary | ICD-10-CM | POA: Diagnosis not present

## 2017-01-03 DIAGNOSIS — E119 Type 2 diabetes mellitus without complications: Secondary | ICD-10-CM | POA: Diagnosis not present

## 2017-01-06 DIAGNOSIS — I110 Hypertensive heart disease with heart failure: Secondary | ICD-10-CM | POA: Diagnosis not present

## 2017-01-06 DIAGNOSIS — I4891 Unspecified atrial fibrillation: Secondary | ICD-10-CM | POA: Diagnosis not present

## 2017-01-06 DIAGNOSIS — Z950 Presence of cardiac pacemaker: Secondary | ICD-10-CM | POA: Diagnosis not present

## 2017-01-06 DIAGNOSIS — G4730 Sleep apnea, unspecified: Secondary | ICD-10-CM | POA: Diagnosis not present

## 2017-01-06 DIAGNOSIS — L97811 Non-pressure chronic ulcer of other part of right lower leg limited to breakdown of skin: Secondary | ICD-10-CM | POA: Diagnosis not present

## 2017-01-06 DIAGNOSIS — L97812 Non-pressure chronic ulcer of other part of right lower leg with fat layer exposed: Secondary | ICD-10-CM | POA: Diagnosis not present

## 2017-01-06 DIAGNOSIS — I890 Lymphedema, not elsewhere classified: Secondary | ICD-10-CM | POA: Diagnosis not present

## 2017-01-06 DIAGNOSIS — E785 Hyperlipidemia, unspecified: Secondary | ICD-10-CM | POA: Diagnosis not present

## 2017-01-06 DIAGNOSIS — L97821 Non-pressure chronic ulcer of other part of left lower leg limited to breakdown of skin: Secondary | ICD-10-CM | POA: Diagnosis not present

## 2017-01-06 DIAGNOSIS — I872 Venous insufficiency (chronic) (peripheral): Secondary | ICD-10-CM | POA: Diagnosis not present

## 2017-01-06 DIAGNOSIS — L97822 Non-pressure chronic ulcer of other part of left lower leg with fat layer exposed: Secondary | ICD-10-CM | POA: Diagnosis not present

## 2017-01-06 DIAGNOSIS — I87313 Chronic venous hypertension (idiopathic) with ulcer of bilateral lower extremity: Secondary | ICD-10-CM | POA: Diagnosis not present

## 2017-01-06 DIAGNOSIS — E11622 Type 2 diabetes mellitus with other skin ulcer: Secondary | ICD-10-CM | POA: Diagnosis not present

## 2017-01-06 DIAGNOSIS — I509 Heart failure, unspecified: Secondary | ICD-10-CM | POA: Diagnosis not present

## 2017-01-06 DIAGNOSIS — N400 Enlarged prostate without lower urinary tract symptoms: Secondary | ICD-10-CM | POA: Diagnosis not present

## 2017-01-06 DIAGNOSIS — D573 Sickle-cell trait: Secondary | ICD-10-CM | POA: Diagnosis not present

## 2017-01-10 DIAGNOSIS — I4891 Unspecified atrial fibrillation: Secondary | ICD-10-CM | POA: Diagnosis not present

## 2017-01-11 DIAGNOSIS — I87311 Chronic venous hypertension (idiopathic) with ulcer of right lower extremity: Secondary | ICD-10-CM | POA: Diagnosis not present

## 2017-01-11 DIAGNOSIS — I87312 Chronic venous hypertension (idiopathic) with ulcer of left lower extremity: Secondary | ICD-10-CM | POA: Diagnosis not present

## 2017-01-11 DIAGNOSIS — L97912 Non-pressure chronic ulcer of unspecified part of right lower leg with fat layer exposed: Secondary | ICD-10-CM | POA: Diagnosis not present

## 2017-01-11 DIAGNOSIS — L97822 Non-pressure chronic ulcer of other part of left lower leg with fat layer exposed: Secondary | ICD-10-CM | POA: Diagnosis not present

## 2017-01-11 DIAGNOSIS — L97812 Non-pressure chronic ulcer of other part of right lower leg with fat layer exposed: Secondary | ICD-10-CM | POA: Diagnosis not present

## 2017-01-11 DIAGNOSIS — E119 Type 2 diabetes mellitus without complications: Secondary | ICD-10-CM | POA: Diagnosis not present

## 2017-01-18 ENCOUNTER — Encounter (HOSPITAL_COMMUNITY): Payer: Self-pay | Admitting: Internal Medicine

## 2017-01-18 ENCOUNTER — Inpatient Hospital Stay (HOSPITAL_COMMUNITY)
Admission: AD | Admit: 2017-01-18 | Discharge: 2017-01-25 | DRG: 291 | Disposition: A | Discharge status: Skilled Nursing Facility | Payer: Medicare Other | Source: Other Acute Inpatient Hospital | Attending: Internal Medicine | Admitting: Internal Medicine

## 2017-01-18 ENCOUNTER — Telehealth: Payer: Self-pay | Admitting: Internal Medicine

## 2017-01-18 DIAGNOSIS — R0603 Acute respiratory distress: Secondary | ICD-10-CM | POA: Diagnosis not present

## 2017-01-18 DIAGNOSIS — Z96659 Presence of unspecified artificial knee joint: Secondary | ICD-10-CM | POA: Diagnosis present

## 2017-01-18 DIAGNOSIS — N179 Acute kidney failure, unspecified: Secondary | ICD-10-CM | POA: Diagnosis present

## 2017-01-18 DIAGNOSIS — Z95810 Presence of automatic (implantable) cardiac defibrillator: Secondary | ICD-10-CM | POA: Diagnosis not present

## 2017-01-18 DIAGNOSIS — E7800 Pure hypercholesterolemia, unspecified: Secondary | ICD-10-CM | POA: Diagnosis present

## 2017-01-18 DIAGNOSIS — I10 Essential (primary) hypertension: Secondary | ICD-10-CM | POA: Diagnosis present

## 2017-01-18 DIAGNOSIS — R079 Chest pain, unspecified: Secondary | ICD-10-CM

## 2017-01-18 DIAGNOSIS — E875 Hyperkalemia: Secondary | ICD-10-CM | POA: Diagnosis present

## 2017-01-18 DIAGNOSIS — I493 Ventricular premature depolarization: Secondary | ICD-10-CM | POA: Diagnosis present

## 2017-01-18 DIAGNOSIS — I5023 Acute on chronic systolic (congestive) heart failure: Secondary | ICD-10-CM | POA: Diagnosis present

## 2017-01-18 DIAGNOSIS — R0602 Shortness of breath: Secondary | ICD-10-CM | POA: Diagnosis not present

## 2017-01-18 DIAGNOSIS — I5043 Acute on chronic combined systolic (congestive) and diastolic (congestive) heart failure: Secondary | ICD-10-CM | POA: Diagnosis not present

## 2017-01-18 DIAGNOSIS — I87399 Chronic venous hypertension (idiopathic) with other complications of unspecified lower extremity: Secondary | ICD-10-CM | POA: Diagnosis not present

## 2017-01-18 DIAGNOSIS — E1122 Type 2 diabetes mellitus with diabetic chronic kidney disease: Secondary | ICD-10-CM | POA: Diagnosis present

## 2017-01-18 DIAGNOSIS — I82622 Acute embolism and thrombosis of deep veins of left upper extremity: Secondary | ICD-10-CM | POA: Diagnosis present

## 2017-01-18 DIAGNOSIS — I82A22 Chronic embolism and thrombosis of left axillary vein: Secondary | ICD-10-CM | POA: Diagnosis not present

## 2017-01-18 DIAGNOSIS — I509 Heart failure, unspecified: Secondary | ICD-10-CM

## 2017-01-18 DIAGNOSIS — I272 Pulmonary hypertension, unspecified: Secondary | ICD-10-CM | POA: Diagnosis present

## 2017-01-18 DIAGNOSIS — I5020 Unspecified systolic (congestive) heart failure: Secondary | ICD-10-CM | POA: Diagnosis present

## 2017-01-18 DIAGNOSIS — I4891 Unspecified atrial fibrillation: Secondary | ICD-10-CM | POA: Diagnosis present

## 2017-01-18 DIAGNOSIS — M7989 Other specified soft tissue disorders: Secondary | ICD-10-CM | POA: Diagnosis not present

## 2017-01-18 DIAGNOSIS — N183 Chronic kidney disease, stage 3 (moderate): Secondary | ICD-10-CM | POA: Diagnosis not present

## 2017-01-18 DIAGNOSIS — I2510 Atherosclerotic heart disease of native coronary artery without angina pectoris: Secondary | ICD-10-CM | POA: Diagnosis present

## 2017-01-18 DIAGNOSIS — N189 Chronic kidney disease, unspecified: Secondary | ICD-10-CM

## 2017-01-18 DIAGNOSIS — Z950 Presence of cardiac pacemaker: Secondary | ICD-10-CM | POA: Diagnosis present

## 2017-01-18 DIAGNOSIS — E119 Type 2 diabetes mellitus without complications: Secondary | ICD-10-CM | POA: Diagnosis not present

## 2017-01-18 DIAGNOSIS — R0902 Hypoxemia: Secondary | ICD-10-CM | POA: Diagnosis present

## 2017-01-18 DIAGNOSIS — R7989 Other specified abnormal findings of blood chemistry: Secondary | ICD-10-CM | POA: Diagnosis present

## 2017-01-18 DIAGNOSIS — I5082 Biventricular heart failure: Secondary | ICD-10-CM | POA: Diagnosis present

## 2017-01-18 DIAGNOSIS — E872 Acidosis: Secondary | ICD-10-CM | POA: Diagnosis not present

## 2017-01-18 DIAGNOSIS — I480 Paroxysmal atrial fibrillation: Secondary | ICD-10-CM | POA: Diagnosis present

## 2017-01-18 DIAGNOSIS — R34 Anuria and oliguria: Secondary | ICD-10-CM | POA: Diagnosis present

## 2017-01-18 DIAGNOSIS — I481 Persistent atrial fibrillation: Secondary | ICD-10-CM | POA: Diagnosis not present

## 2017-01-18 DIAGNOSIS — N19 Unspecified kidney failure: Secondary | ICD-10-CM | POA: Diagnosis not present

## 2017-01-18 DIAGNOSIS — Z833 Family history of diabetes mellitus: Secondary | ICD-10-CM

## 2017-01-18 DIAGNOSIS — I429 Cardiomyopathy, unspecified: Secondary | ICD-10-CM | POA: Diagnosis present

## 2017-01-18 DIAGNOSIS — L97218 Non-pressure chronic ulcer of right calf with other specified severity: Secondary | ICD-10-CM | POA: Diagnosis present

## 2017-01-18 DIAGNOSIS — Z7901 Long term (current) use of anticoagulants: Secondary | ICD-10-CM

## 2017-01-18 DIAGNOSIS — I517 Cardiomegaly: Secondary | ICD-10-CM | POA: Diagnosis not present

## 2017-01-18 DIAGNOSIS — Z66 Do not resuscitate: Secondary | ICD-10-CM | POA: Diagnosis present

## 2017-01-18 DIAGNOSIS — E785 Hyperlipidemia, unspecified: Secondary | ICD-10-CM | POA: Diagnosis present

## 2017-01-18 DIAGNOSIS — I130 Hypertensive heart and chronic kidney disease with heart failure and stage 1 through stage 4 chronic kidney disease, or unspecified chronic kidney disease: Principal | ICD-10-CM | POA: Diagnosis present

## 2017-01-18 DIAGNOSIS — Z8249 Family history of ischemic heart disease and other diseases of the circulatory system: Secondary | ICD-10-CM

## 2017-01-18 DIAGNOSIS — I872 Venous insufficiency (chronic) (peripheral): Secondary | ICD-10-CM | POA: Diagnosis not present

## 2017-01-18 DIAGNOSIS — I1310 Hypertensive heart and chronic kidney disease without heart failure, with stage 1 through stage 4 chronic kidney disease, or unspecified chronic kidney disease: Secondary | ICD-10-CM | POA: Diagnosis present

## 2017-01-18 DIAGNOSIS — R188 Other ascites: Secondary | ICD-10-CM | POA: Diagnosis present

## 2017-01-18 DIAGNOSIS — L8990 Pressure ulcer of unspecified site, unspecified stage: Secondary | ICD-10-CM | POA: Diagnosis not present

## 2017-01-18 DIAGNOSIS — Z87891 Personal history of nicotine dependence: Secondary | ICD-10-CM

## 2017-01-18 DIAGNOSIS — J9600 Acute respiratory failure, unspecified whether with hypoxia or hypercapnia: Secondary | ICD-10-CM | POA: Diagnosis not present

## 2017-01-18 DIAGNOSIS — I6789 Other cerebrovascular disease: Secondary | ICD-10-CM | POA: Diagnosis not present

## 2017-01-18 DIAGNOSIS — I472 Ventricular tachycardia: Secondary | ICD-10-CM | POA: Diagnosis present

## 2017-01-18 DIAGNOSIS — R945 Abnormal results of liver function studies: Secondary | ICD-10-CM | POA: Diagnosis not present

## 2017-01-18 DIAGNOSIS — R791 Abnormal coagulation profile: Secondary | ICD-10-CM | POA: Diagnosis not present

## 2017-01-18 DIAGNOSIS — N170 Acute kidney failure with tubular necrosis: Secondary | ICD-10-CM | POA: Diagnosis not present

## 2017-01-18 DIAGNOSIS — R570 Cardiogenic shock: Secondary | ICD-10-CM | POA: Diagnosis not present

## 2017-01-18 DIAGNOSIS — Z79899 Other long term (current) drug therapy: Secondary | ICD-10-CM

## 2017-01-18 DIAGNOSIS — I878 Other specified disorders of veins: Secondary | ICD-10-CM | POA: Diagnosis present

## 2017-01-18 DIAGNOSIS — D689 Coagulation defect, unspecified: Secondary | ICD-10-CM | POA: Diagnosis not present

## 2017-01-18 DIAGNOSIS — K8020 Calculus of gallbladder without cholecystitis without obstruction: Secondary | ICD-10-CM | POA: Diagnosis not present

## 2017-01-18 DIAGNOSIS — J90 Pleural effusion, not elsewhere classified: Secondary | ICD-10-CM | POA: Diagnosis not present

## 2017-01-18 DIAGNOSIS — J9811 Atelectasis: Secondary | ICD-10-CM | POA: Diagnosis not present

## 2017-01-18 DIAGNOSIS — R740 Nonspecific elevation of levels of transaminase and lactic acid dehydrogenase [LDH]: Secondary | ICD-10-CM | POA: Diagnosis present

## 2017-01-18 DIAGNOSIS — Z794 Long term (current) use of insulin: Secondary | ICD-10-CM

## 2017-01-18 DIAGNOSIS — S0990XA Unspecified injury of head, initial encounter: Secondary | ICD-10-CM | POA: Diagnosis not present

## 2017-01-18 DIAGNOSIS — R4182 Altered mental status, unspecified: Secondary | ICD-10-CM | POA: Diagnosis not present

## 2017-01-18 DIAGNOSIS — J989 Respiratory disorder, unspecified: Secondary | ICD-10-CM | POA: Diagnosis not present

## 2017-01-18 HISTORY — DX: Unspecified atrial fibrillation: I48.91

## 2017-01-18 HISTORY — DX: Unspecified systolic (congestive) heart failure: I50.20

## 2017-01-18 LAB — MRSA PCR SCREENING: MRSA by PCR: NEGATIVE

## 2017-01-18 MED ORDER — SODIUM CHLORIDE 0.9% FLUSH: 3.0000 mL | INTRAVENOUS | Status: DC | PRN

## 2017-01-18 MED ORDER — INSULIN ASPART 100 UNIT/ML ~~LOC~~ SOLN
0.0000 [IU] | Freq: Three times a day (TID) | SUBCUTANEOUS | Status: DC
  Administered 2017-01-19: 2 [IU] via SUBCUTANEOUS
  Administered 2017-01-19: 1 [IU] via SUBCUTANEOUS
  Administered 2017-01-19: 2 [IU] via SUBCUTANEOUS
  Administered 2017-01-20: 3 [IU] via SUBCUTANEOUS
  Administered 2017-01-21: 1 [IU] via SUBCUTANEOUS
  Administered 2017-01-21: 2 [IU] via SUBCUTANEOUS
  Administered 2017-01-21: 3 [IU] via SUBCUTANEOUS
  Administered 2017-01-22: 1 [IU] via SUBCUTANEOUS
  Administered 2017-01-22: 3 [IU] via SUBCUTANEOUS
  Administered 2017-01-22: 5 [IU] via SUBCUTANEOUS
  Administered 2017-01-23 – 2017-01-24 (×2): 3 [IU] via SUBCUTANEOUS
  Administered 2017-01-24: 1 [IU] via SUBCUTANEOUS
  Administered 2017-01-24: 2 [IU] via SUBCUTANEOUS
  Administered 2017-01-25: 1 [IU] via SUBCUTANEOUS

## 2017-01-18 MED ORDER — FUROSEMIDE 10 MG/ML IJ SOLN
40.0000 mg | Freq: Two times a day (BID) | INTRAMUSCULAR | Status: DC
  Administered 2017-01-18: 40 mg via INTRAVENOUS
  Filled 2017-01-18 (×2): qty 4

## 2017-01-18 MED ORDER — INSULIN ASPART 100 UNIT/ML ~~LOC~~ SOLN
0.0000 [IU] | Freq: Every day | SUBCUTANEOUS | Status: DC
  Administered 2017-01-20 – 2017-01-24 (×2): 2 [IU] via SUBCUTANEOUS

## 2017-01-18 MED ORDER — SODIUM POLYSTYRENE SULFONATE 15 GM/60ML PO SUSP
15.0000 g | Freq: Once | ORAL | Status: AC
  Administered 2017-01-18: 15 g via ORAL
  Filled 2017-01-18: qty 60

## 2017-01-18 MED ORDER — METOPROLOL TARTRATE 25 MG PO TABS
25.0000 mg | ORAL_TABLET | Freq: Two times a day (BID) | ORAL | Status: DC
  Administered 2017-01-18: 25 mg via ORAL
  Filled 2017-01-18: qty 1

## 2017-01-18 MED ORDER — FOLIC ACID 1 MG PO TABS
1.0000 mg | ORAL_TABLET | Freq: Every day | ORAL | Status: DC
  Administered 2017-01-20 – 2017-01-25 (×6): 1 mg via ORAL
  Filled 2017-01-18 (×6): qty 1

## 2017-01-18 MED ORDER — SODIUM CHLORIDE 0.9 % IV SOLN: 250.0000 mL | INTRAVENOUS | Status: DC | PRN

## 2017-01-18 MED ORDER — SODIUM CHLORIDE 0.9% FLUSH
3.0000 mL | Freq: Two times a day (BID) | INTRAVENOUS | Status: DC
  Administered 2017-01-18 – 2017-01-24 (×13): 3 mL via INTRAVENOUS

## 2017-01-18 MED ORDER — DOXAZOSIN MESYLATE 4 MG PO TABS
4.0000 mg | ORAL_TABLET | Freq: Every day | ORAL | Status: DC
  Administered 2017-01-20 – 2017-01-25 (×6): 4 mg via ORAL
  Filled 2017-01-18 (×2): qty 1
  Filled 2017-01-18: qty 4
  Filled 2017-01-18 (×3): qty 1

## 2017-01-18 MED ORDER — SIMVASTATIN 40 MG PO TABS: 40.0000 mg | ORAL_TABLET | Freq: Every day | ORAL | Status: DC

## 2017-01-18 MED ORDER — ZOLPIDEM TARTRATE 5 MG PO TABS
5.0000 mg | ORAL_TABLET | Freq: Every evening | ORAL | Status: DC | PRN
  Administered 2017-01-19: 5 mg via ORAL
  Filled 2017-01-18: qty 1

## 2017-01-18 MED ORDER — ONDANSETRON HCL 4 MG/2ML IJ SOLN: 4.0000 mg | Freq: Four times a day (QID) | INTRAMUSCULAR | Status: DC | PRN

## 2017-01-18 NOTE — H&P (Signed)
History and Physical    Bruce Price LJQ:[id number] DOB: Jun 11, 1941 DOA: 01/18/2017  Referring MD/NP/PA:   PCP: Garwin Brothers, MD   Patient coming from:  The patient is coming from home.  At baseline, pt is partially dependent for most of ADL.   Chief Complaint: Shortness of breath, leg edema, orthopnea, decreased urine output  HPI: Bruce Price is a 76 y.o. male with medical history significant of sCHF (per EDP, Dr. Baxter Flattery, EF is 25%, no 2d ehco available in Epic), s/p of AICD, hypertension, hyperlipidemia, diabetes mellitus, gout, chronic venous insufficiency in legs, who presents with shortness of breath, leg edema, orthopnea, decreased urine output.  Pt is transferred from [facility] per EDP, Dr. Baxter Flattery.  Pt states that he has been having shortness of breath, leg edema, orthopnea, decreased urine output for several days, which has been progressively getting worse today. Patient has dry cough, no chest pain, fever or chills. Has nausea, no vomiting, diarrhea, abdominal pain or symptoms of UTI. No unilateral weakness, slurred speech or facial droop. No hematuria, hematemesis or hematochezia.  Patient was initially evaluated in [facility]. He was found to have BNP 51400, troponin 0.10, digoix 2.1 (up normal limit is 2.0), WBC 9.8, hemoglobin 14.8, INR 8.0, lactic acid 7.1, negative urinalysis, potassium 5.4, creatinine is up from 1.7 to 3.0 per EDP, bicarbonate 18, AG 30, abnormal liver functions with ALP and 22, AST 191, ALT 113, total bilirubin 2.3, temperature normal, tachycardia with heart rate 112, respiration rate 18, oxygen saturation 95% on room air, blood pressure 176/69. Pt received 600 cc of NS in ED, his lactic acid level decreased from 7.1-->5.0.  CT-chest showed Cardiomegaly, small pericardial effusion, AICD, small bilateral pleural effusions and subsegmental atelectasis CT abdomen/pelvis showed cholelithiasis, diffuse fatty straining about the kidneys, in the  retroperitoneal fat and subcutaneous fat suggesting third of fluid, moderate ascites, no bowel obstruction. CT head:  Negative for acute intracranial abnormalities. US-RUQ showed gallstone 2.5 cm, gallbladder wall thickening 26.5, this could be from hypoproteinemia. Radiologist. Cholecystitis cannot be excluded completely. Mild ascites.  Review of Systems:   General: no fevers, chills, has poor appetite, has fatigue HEENT: no blurry vision, hearing changes or sore throat Respiratory: has dyspnea, coughing, no wheezing CV: no chest pain, no palpitations GI: has nausea, no vomiting, abdominal pain, diarrhea, constipation GU: no dysuria, burning on urination, increased urinary frequency, hematuria.  Ext: has leg edema Neuro: no unilateral weakness, numbness, or tingling, no vision change or hearing loss Skin: has chronic venous insufficiency changes in both legs, with multiple skin lesions in legs. MSK: No muscle spasm, no deformity, no limitation of range of movement in spin Heme: No easy bruising.  Travel history: No recent long distant travel.  Allergy: No Known Allergies  Past Medical History:  Diagnosis Date  . Atrial fibrillation ([place])   . Diabetes mellitus   . High blood pressure   . High cholesterol   . Systolic CHF [facility])     Past Surgical History:  Procedure Laterality Date  . HERNIA REPAIR    . Knee replacement    . TONSILLECTOMY      Social History:  reports that he quit smoking about 48 years ago. His smoking use included Cigarettes. He has a 60.00 pack-year smoking history. He has never used smokeless tobacco. He reports that he does not drink alcohol or use drugs.  Family History:  Family History  Problem Relation Age of Onset  . Heart disease Mother   .  Diabetes Mellitus II Mother      Prior to Admission medications   Medication Sig Start Date End Date Taking? Authorizing Provider  AMLODIPINE BESYLATE PO Take by mouth.    Historical Provider, MD    CARVEDILOL PO Take by mouth.    Historical Provider, MD  fish oil-omega-3 fatty acids 1000 MG capsule Take 2 g by mouth daily.    Historical Provider, MD  furosemide (LASIX) 40 MG tablet Take 40 mg by mouth 2 (two) times daily.    Historical Provider, MD  Insulin Admin Supplies KIT by Does not apply route.      Historical Provider, MD  insulin detemir (LEVEMIR) 100 UNIT/ML injection Inject into the skin at bedtime. 70 u in am    Historical Provider, MD  NOVOLOG FLEXPEN 100 UNIT/ML injection 20 u at night 02/04/12   Historical Provider, MD  Sildenafil Citrate (VIAGRA PO) Take by mouth.    Historical Provider, MD  Warfarin Sodium (COUMADIN PO) Take 8 mg by mouth daily.    Historical Provider, MD  Zinc-Magnesium Aspart-Vit B6 (ZINC MAGNESIUM ASPARTATE PO) Take by mouth.    Historical Provider, MD  Zn-Pyg Afri-Nettle-Saw Palmet (SAW PALMETTO COMPLEX PO) Take by mouth.    Historical Provider, MD    Physical Exam: Vitals:   01/18/17 2029 01/18/17 2215 01/18/17 2238  BP: 116/73    Pulse: (!) 112    Resp: 20    Temp:   97.3 F (36.3 C)  TempSrc:   Oral  SpO2: 97%    Weight:  101.1 kg (222 lb 14.2 oz)   Height:  6' (1.829 m)    General: Not in acute distress HEENT:       Eyes: PERRL, EOMI, no scleral icterus.       ENT: No discharge from the ears and nose, no pharynx injection, no tonsillar enlargement.        Neck: positive JVD, no bruit, no mass felt. Heme: No neck lymph node enlargement. Cardiac: S1/S2, RRR, No murmurs, No gallops or rubs. Respiratory: No rales, wheezing, rhonchi or rubs. GI: Soft, nondistended, nontender, no rebound pain, no organomegaly, BS present. GU: No hematuria Ext: 2+ pitting leg edema bilaterally. 2+DP/PT pulse bilaterally. Musculoskeletal: No joint deformities, No joint redness or warmth, no limitation of ROM in spin. Skin: has chronic venous insufficiency changes in both legs, with multiple skin lesions in legs. Neuro: Alert, oriented X3, cranial nerves  II-XII grossly intact, moves all extremities normally.  Psych: Patient is not psychotic, no suicidal or hemocidal ideation.  Labs on Admission: I have personally reviewed following labs and imaging studies  CBC: No results for input(s): WBC, NEUTROABS, HGB, HCT, MCV, PLT in the last 168 hours. Basic Metabolic Panel: No results for input(s): NA, K, CL, CO2, GLUCOSE, BUN, CREATININE, CALCIUM, MG, PHOS in the last 168 hours. GFR: CrCl cannot be calculated (Patient's most recent lab result is older than the maximum 21 days allowed.). Liver Function Tests: No results for input(s): AST, ALT, ALKPHOS, BILITOT, PROT, ALBUMIN in the last 168 hours. No results for input(s): LIPASE, AMYLASE in the last 168 hours. No results for input(s): AMMONIA in the last 168 hours. Coagulation Profile: No results for input(s): INR, PROTIME in the last 168 hours. Cardiac Enzymes: No results for input(s): CKTOTAL, CKMB, CKMBINDEX, TROPONINI in the last 168 hours. BNP (last 3 results) No results for input(s): PROBNP in the last 8760 hours. HbA1C: No results for input(s): HGBA1C in the last 72 hours. CBG: No results for  input(s): GLUCAP in the last 168 hours. Lipid Profile: No results for input(s): CHOL, HDL, LDLCALC, TRIG, CHOLHDL, LDLDIRECT in the last 72 hours. Thyroid Function Tests: No results for input(s): TSH, T4TOTAL, FREET4, T3FREE, THYROIDAB in the last 72 hours. Anemia Panel: No results for input(s): VITAMINB12, FOLATE, FERRITIN, TIBC, IRON, RETICCTPCT in the last 72 hours. Urine analysis: No results found for: COLORURINE, APPEARANCEUR, LABSPEC, [place], GLUCOSEU, HGBUR, BILIRUBINUR, KETONESUR, PROTEINUR, UROBILINOGEN, NITRITE, LEUKOCYTESUR Sepsis Labs: '@LABRCNTIP' (procalcitonin:4,lacticidven:4) ) Recent Results (from the past 240 hour(s))  MRSA PCR Screening     Status: None   Collection Time: 01/18/17  9:47 PM  Result Value Ref Range Status   MRSA by PCR NEGATIVE NEGATIVE Final    Comment:         The GeneXpert MRSA Assay (FDA approved for NASAL specimens only), is one component of a comprehensive MRSA colonization surveillance program. It is not intended to diagnose MRSA infection nor to guide or monitor treatment for MRSA infections.      Radiological Exams on Admission: No results found.   EKG: Independently reviewed.  Sinus rhythm, regular, QTC 471, ST depression and T-wave inversion in V1-V3, QRS widening, which is old.   Assessment/Plan Principal Problem:   Acute on chronic systolic CHF (congestive heart failure) (HCC) Active Problems:   Essential hypertension   High cholesterol   Diabetes mellitus (HCC)   Atrial fibrillation (HCC)   Systolic CHF (HCC)   Elevated lactic acid level   Acute on chronic kidney failure (HCC)   Coagulopathy (HCC)   Abnormal LFTs   Acute respiratory distress   Chronic venous insufficiency   AICD (automatic cardioverter/defibrillator) present   CHF exacerbation (HCC)  Acute respiratory distress due to acute on chronic systolic CHF: Patient has elevated BNP 51400, bilateral leg edema and positive JVD, consistent with CHF exacerbation. Per EDP, Dr. Baxter Flattery, pt's EF is 25% (no 2d ehco available in Epic). Since pt has elevated lactic acid , but no signs of infection, pt likely has hypoperfusion Given his lowe EF 25%. Card, Dr. Meda Coffee was consulted by EDP-->recommended to admit to SDU and OK to diurese patient per EDP's report. Cardio will see pt in morning.  -will admit to SDU as inpt -Lasix 40 mg bid by IV -trop x 3 -2d echo -Risk factor stratification: A1c, FLP -will continue home metoprolol, but decreased dose from 50 to 25 mg daily -Daily weights -strict I/O's -Low salt diet  Atrial Fibrillation and Coagulopathy due to coumadin: CHA2DS2-VASc Score is 5 , needs oral anticoagulation. Patient is on Coumadin at home. INR is 8.0 in ED. Heart rate is ~110s. No bleeding tendency.  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 Pt used to be on levemir, but currently patient is not taking meds at home (no DM meds in his med list from [place]). CBG 190. -will decrease Lantus dose from   -SSI -Check A1c  Elevated lactic acid level: 7.1-->5.0. No signs of infection. No fever or chills. Urinalysis negative. Most likely due to hypoperfusion versus decreased clearance of from her worsening renal function. -Trend lactic acid leves  Acute on chronic CKD: Per ED physician, creatinine is up from 1.7-3.0. Unclear stage of kidney disease, likely stage III. This is  possibly due to hypoperfusion due to CHF. -check FeUrea -f/u renal Fx by BMP  Abnormal LFTs: abnormal liver functions with ALP and 22, AST 191, ALT 113, total bilirubin 2.3. US-RUQ showed gallstone 2.5 cm, gallbladder wall thickening 26.5, this could be from hypoproteinemia per radiologist. Cholecystitis cannot be excluded completely. Mild ascites. Pt dose not have fever or leukocytosis or AP, dose not seem to have cholecystitis. Possible explanation is hepatic congestion secondary to worsening CHF -check hepatitis panel -Hold Zocor  Chronic venous insufficiency and skin lesions in legs. -consult to wound care.  Hyperkalemia: No EKG change. -Kayexalate 15 g 1    DVT ppx: SCD, on Coumdin with INR 8.0 Code Status: DNR. Patient is alert, oriented 3. Patient is competent. I discussed CODE STATUS with patient in the presence of three nurses. I explained the meaning of CODE STATUS clearly to pt. Patient would wants to be DNR. Family Communication: None at bed side.     Disposition Plan:  Anticipate discharge back to previous home environment Consults called:  Card, Dr. Meda Coffee Admission status:   SDU/inpation       Date of Service 01/19/2017    Jasminne Mealy, [facility] Pager [phone]  If 7PM-7AM, please contact night-coverage [email or web address] Password [facility] 01/19/2017, 12:19 AM

## 2017-01-19 ENCOUNTER — Inpatient Hospital Stay (HOSPITAL_COMMUNITY): Payer: Medicare Other

## 2017-01-19 ENCOUNTER — Encounter (HOSPITAL_COMMUNITY): Payer: Self-pay | Admitting: Internal Medicine

## 2017-01-19 ENCOUNTER — Other Ambulatory Visit (HOSPITAL_COMMUNITY): Payer: Medicare Other

## 2017-01-19 DIAGNOSIS — I10 Essential (primary) hypertension: Secondary | ICD-10-CM

## 2017-01-19 DIAGNOSIS — N19 Unspecified kidney failure: Secondary | ICD-10-CM

## 2017-01-19 DIAGNOSIS — I5023 Acute on chronic systolic (congestive) heart failure: Secondary | ICD-10-CM

## 2017-01-19 DIAGNOSIS — R945 Abnormal results of liver function studies: Secondary | ICD-10-CM

## 2017-01-19 DIAGNOSIS — R570 Cardiogenic shock: Secondary | ICD-10-CM | POA: Diagnosis present

## 2017-01-19 DIAGNOSIS — N179 Acute kidney failure, unspecified: Secondary | ICD-10-CM

## 2017-01-19 DIAGNOSIS — I1310 Hypertensive heart and chronic kidney disease without heart failure, with stage 1 through stage 4 chronic kidney disease, or unspecified chronic kidney disease: Secondary | ICD-10-CM | POA: Diagnosis present

## 2017-01-19 DIAGNOSIS — R7989 Other specified abnormal findings of blood chemistry: Secondary | ICD-10-CM

## 2017-01-19 DIAGNOSIS — I4891 Unspecified atrial fibrillation: Secondary | ICD-10-CM

## 2017-01-19 DIAGNOSIS — N189 Chronic kidney disease, unspecified: Secondary | ICD-10-CM

## 2017-01-19 DIAGNOSIS — Z950 Presence of cardiac pacemaker: Secondary | ICD-10-CM

## 2017-01-19 DIAGNOSIS — E7800 Pure hypercholesterolemia, unspecified: Secondary | ICD-10-CM

## 2017-01-19 LAB — BASIC METABOLIC PANEL
ANION GAP: 20 — AB (ref 5–15)
BUN: 67 mg/dL — ABNORMAL HIGH (ref 6–20)
CO2: 22 mmol/L (ref 22–32)
Calcium: 8.8 mg/dL — ABNORMAL LOW (ref 8.9–10.3)
Chloride: 93 mmol/L — ABNORMAL LOW (ref 101–111)
Creatinine, Ser: 3.21 mg/dL — ABNORMAL HIGH (ref 0.61–1.24)
GFR calc Af Amer: 20 mL/min — ABNORMAL LOW (ref >60)
GFR, EST NON AFRICAN AMERICAN: 17 mL/min — AB (ref >60)
Glucose, Bld: 147 mg/dL — ABNORMAL HIGH (ref 65–99)
POTASSIUM: 5.5 mmol/L — AB (ref 3.5–5.1)
SODIUM: 135 mmol/L (ref 135–145)

## 2017-01-19 LAB — LIPID PANEL
Cholesterol: 103 mg/dL (ref 0–200)
HDL: 19 mg/dL — ABNORMAL LOW (ref >40)
LDL CALC: 54 mg/dL (ref 0–99)
Total CHOL/HDL Ratio: 5.4 RATIO
Triglycerides: 152 mg/dL — ABNORMAL HIGH (ref <150)
VLDL: 30 mg/dL (ref 0–40)

## 2017-01-19 LAB — GLUCOSE, CAPILLARY
GLUCOSE-CAPILLARY: 131 mg/dL — AB (ref 65–99)
GLUCOSE-CAPILLARY: 158 mg/dL — AB (ref 65–99)
Glucose-Capillary: 155 mg/dL — ABNORMAL HIGH (ref 65–99)
Glucose-Capillary: 129 mg/dL — ABNORMAL HIGH (ref 65–99)
Glucose-Capillary: 168 mg/dL — ABNORMAL HIGH (ref 65–99)

## 2017-01-19 LAB — DIGOXIN LEVEL: DIGOXIN LVL: 1.6 ng/mL (ref 0.8–2.0)

## 2017-01-19 LAB — CREATININE, URINE, RANDOM: CREATININE, URINE: 146.57 mg/dL

## 2017-01-19 LAB — LACTIC ACID, PLASMA: Lactic Acid, Venous: 4.5 mmol/L (ref 0.5–1.9)

## 2017-01-19 LAB — PROTIME-INR
INR: 7.22 — AB
PROTHROMBIN TIME: 64.2 s — AB (ref 11.4–15.2)

## 2017-01-19 LAB — BRAIN NATRIURETIC PEPTIDE: B Natriuretic Peptide: 3881.3 pg/mL — ABNORMAL HIGH (ref 0.0–100.0)

## 2017-01-19 LAB — TROPONIN I
TROPONIN I: 0.06 ng/mL — AB (ref <0.03)
TROPONIN I: 0.06 ng/mL — AB (ref <0.03)
Troponin I: 0.06 ng/mL (ref <0.03)

## 2017-01-19 MED ORDER — SODIUM POLYSTYRENE SULFONATE 15 GM/60ML PO SUSP
60.0000 g | Freq: Once | ORAL | Status: AC
  Administered 2017-01-19: 60 g via RECTAL
  Filled 2017-01-19: qty 240

## 2017-01-19 MED ORDER — FUROSEMIDE 10 MG/ML IJ SOLN
60.0000 mg | Freq: Once | INTRAMUSCULAR | Status: AC
  Administered 2017-01-19: 60 mg via INTRAVENOUS

## 2017-01-19 MED ORDER — METOPROLOL TARTRATE 5 MG/5ML IV SOLN
5.0000 mg | Freq: Four times a day (QID) | INTRAVENOUS | Status: DC
  Administered 2017-01-19 (×2): 5 mg via INTRAVENOUS
  Filled 2017-01-19 (×2): qty 5

## 2017-01-19 MED ORDER — FUROSEMIDE 10 MG/ML IJ SOLN
40.0000 mg | Freq: Two times a day (BID) | INTRAMUSCULAR | Status: DC
  Administered 2017-01-19: 40 mg via INTRAVENOUS
  Filled 2017-01-19: qty 4

## 2017-01-19 NOTE — Progress Notes (Signed)
PROGRESS NOTE    Bruce Price  YEB:[id number] DOB: 1941-07-28 DOA: 01/18/2017 PCP: Eloisa Northern, MD    Brief Narrative:  76 y.o. male with medical history significant of sCHF (per EDP, Dr. Drue Second, EF is 25%, no 2d ehco available in Epic), s/p of AICD, hypertension, hyperlipidemia, diabetes mellitus, gout, chronic venous insufficiency in legs, who presents with shortness of breath, leg edema, orthopnea, decreased urine output.  Pt is transferred from [facility] per EDP, Dr. Drue Second.  Pt states that he has been having shortness of breath, leg edema, orthopnea, decreased urine output for several days, which has been progressively getting worse today. Patient has dry cough, no chest pain, fever or chills. Has nausea, no vomiting, diarrhea, abdominal pain or symptoms of UTI. No unilateral weakness, slurred speech or facial droop. No hematuria, hematemesis or hematochezia.  Patient was initially evaluated in [facility]. He was found to have BNP 51400, troponin 0.10, digoix 2.1 (up normal limit is 2.0), WBC 9.8, hemoglobin 14.8, INR 8.0, lactic acid 7.1, negative urinalysis, potassium 5.4, creatinine is up from 1.7 to 3.0 per EDP, bicarbonate 18, AG 30, abnormal liver functions with ALP and 22, AST 191, ALT 113, total bilirubin 2.3, temperature normal, tachycardia with heart rate 112, respiration rate 18, oxygen saturation 95% on room air, blood pressure 176/69. Pt received 600 cc of NS in ED, his lactic acid level decreased from 7.1-->5.0.  Assessment & Plan:   Principal Problem:   Acute on chronic systolic CHF (congestive heart failure) (HCC) Active Problems:   Essential hypertension   High cholesterol   Diabetes mellitus (HCC)   Atrial fibrillation (HCC)   Systolic CHF (HCC)   Elevated lactic acid level   Acute on chronic kidney failure (HCC)   Coagulopathy (HCC)   Abnormal LFTs   Acute respiratory distress   Chronic venous insufficiency   AICD (automatic  cardioverter/defibrillator) present   CHF exacerbation (HCC)  Acute respiratory distress due to acute on chronic systolic CHF: Patient has elevated BNP 51400, bilateral leg edema and positive JVD, consistent with CHF exacerbation. Pt reported to reported to have EF is 25%. - Cardiology consulted. Appreciate in put. Recommendation to increase dose of lasix -Currently on Lasix 40 mg bid by IV -This AM, pt more hypoxic and sob. Given 60mg  lasix this AM -2d echo ordered, pending -continue low dose beta blocker -Daily weights -strict I/O's -Low salt diet -Repeat bmet in AM - CXR ordered and reviewed. Appears to have increased pleural effusion per my own read, poor inspiratory effort  Atrial Fibrillation and Coagulopathy due to coumadin: CHA2DS2-VASc Score is 5 , needs oral anticoagulation. Patient is on Coumadin at home. INR is 8.0 in ED. Heart rate is ~110s. No bleeding tendency.  -hold coumadin, pharmacy to dose -continue beta blocker as above -holding digoxin  HTN: current bp is 136/69  -continue metoprolol and doxazosin per above -Will continue IV Lasix as above  HLD: Last LDL was not on record -holding zocor due to abnormal liver function  DM-II: Last A1c not on record. Pt used to be on levemir, but currently patient is not taking meds at home (no DM meds in his med list from [place]). CBG 190. -continue SSI coverage  Elevated lactic acid level: 7.1-->5.0. No signs of infection.  - Suspect lactic acidosis secondary to tissue hypoxia  - Continue to monitor  Acute on chronic CKD:  - Outpt labs reviewed. Baseline Cr of around 1-1.5 - BUN/Cr elevated - Given concerns of volume overload, will  continue aggressive diuresis. Family is aware that renal function will likely deteriorate  Abnormal LFTs: abnormal liver functions with ALP and 22, AST 191, ALT 113, total bilirubin 2.3. US-RUQ showed gallstone 2.5 cm, gallbladder wall thickening 26.5, this could be from  hypoproteinemia per radiologist. Cholecystitis cannot be excluded completely. Mild ascites. Pt dose not have fever or leukocytosis or AP, dose not seem to have cholecystitis - Suspect hepatic congestion secondary to acute heart failure -Holding Zocor  Chronic venous insufficiency and skin lesions in legs. -consulted to wound care. - Appreciate in put  Hyperkalemia: No EKG change. -Given Kayexalate 15 g 1 overnight - K this AM 5.5. Will given 60mg  per recal kayexelate  LUE swelling - Noted today - Will request UE doppler to r/o DVT  DVT prophylaxis: coumadin Code Status: DNR Family Communication: Pt in room, updated pt's son and brother Disposition Plan: Uncertain at this time  Consultants:   Cardiology  Procedures:     Antimicrobials: Anti-infectives    None       Subjective: Without complaints  Objective: Vitals:   01/19/17 1000 01/19/17 1100 01/19/17 1200 01/19/17 1300  BP: 126/87  132/71   Pulse: (!) 113 (!) 117 71 73  Resp: (!) 30 (!) 27 (!) 25 (!) 30  Temp:   97.4 F (36.3 C)   TempSrc:   Axillary   SpO2: 100% 92% 100% 100%  Weight:      Height:        Intake/Output Summary (Last 24 hours) at 01/19/17 1640 Last data filed at 01/19/17 1300  Gross per 24 hour  Intake                0 ml  Output                0 ml  Net                0 ml   Filed Weights   01/18/17 2215 01/19/17 0544  Weight: 101.1 kg (222 lb 14.2 oz) 101.7 kg (224 lb 3.3 oz)    Examination:  General exam: Appears calm and comfortable  Respiratory system: mildly increased resp effort, shallow breaths, decreased BS Cardiovascular system: S1 & S2 heard, RRR. B LE edema Gastrointestinal system: Abdomen is nondistended, soft and nontender. No organomegaly or masses felt. Normal bowel sounds heard. Central nervous system: Alert and oriented. No focal neurological deficits. Extremities: Symmetric 5 x 5 power. Skin: No rashes, lesions  Psychiatry: Judgement and insight appear  normal. Mood & affect appropriate.   Data Reviewed: I have personally reviewed following labs and imaging studies  CBC: No results for input(s): WBC, NEUTROABS, HGB, HCT, MCV, PLT in the last 168 hours. Basic Metabolic Panel:  Recent Labs Lab 01/19/17 0858  NA 135  K 5.5*  CL 93*  CO2 22  GLUCOSE 147*  BUN 67*  CREATININE 3.21*  CALCIUM 8.8*   GFR: Estimated Creatinine Clearance: 23.8 mL/min (A) (by C-G formula based on SCr of 3.21 mg/dL (H)). Liver Function Tests: No results for input(s): AST, ALT, ALKPHOS, BILITOT, PROT, ALBUMIN in the last 168 hours. No results for input(s): LIPASE, AMYLASE in the last 168 hours. No results for input(s): AMMONIA in the last 168 hours. Coagulation Profile:  Recent Labs Lab 01/19/17 0655  INR 7.22*   Cardiac Enzymes:  Recent Labs Lab 01/19/17 0022 01/19/17 0655 01/19/17 1130  TROPONINI 0.06* 0.06* 0.06*   BNP (last 3 results) No results for input(s): PROBNP in the last  8760 hours. HbA1C: No results for input(s): HGBA1C in the last 72 hours. CBG:  Recent Labs Lab 01/19/17 0020 01/19/17 0834 01/19/17 1207 01/19/17 1639  GLUCAP 158* 155* 129* 168*   Lipid Profile:  Recent Labs  01/19/17 0858  CHOL 103  HDL 19*  LDLCALC 54  TRIG 161*  CHOLHDL 5.4   Thyroid Function Tests: No results for input(s): TSH, T4TOTAL, FREET4, T3FREE, THYROIDAB in the last 72 hours. Anemia Panel: No results for input(s): VITAMINB12, FOLATE, FERRITIN, TIBC, IRON, RETICCTPCT in the last 72 hours. Sepsis Labs:  Recent Labs Lab 01/19/17 0022  LATICACIDVEN 4.5*    Recent Results (from the past 240 hour(s))  MRSA PCR Screening     Status: None   Collection Time: 01/18/17  9:47 PM  Result Value Ref Range Status   MRSA by PCR NEGATIVE NEGATIVE Final    Comment:        The GeneXpert MRSA Assay (FDA approved for NASAL specimens only), is one component of a comprehensive MRSA colonization surveillance program. It is not intended to  diagnose MRSA infection nor to guide or monitor treatment for MRSA infections.      Radiology Studies: Dg Chest Port 1 View  Result Date: 01/19/2017 CLINICAL DATA:  Congestive heart failure. EXAM: PORTABLE CHEST 1 VIEW COMPARISON:  Radiographs of January 18, 2017. FINDINGS: Stable cardiomegaly. Atherosclerosis of thoracic aorta is noted. Left-sided pacemaker is unchanged in position. Mild central pulmonary vascular congestion is noted. Minimal right pleural effusion is noted along right lateral chest wall. Degenerative changes seen involving both acromioclavicular joints. Left lung is clear. Mild right basilar subsegmental atelectasis is noted. IMPRESSION: Aortic atherosclerosis. Stable cardiomegaly with central pulmonary vascular congestion. Minimal right pleural effusion is noted. Mild right basilar subsegmental atelectasis. Electronically Signed   By: Lupita Raider, M.D.   On: 01/19/2017 09:37    Scheduled Meds: . doxazosin  4 mg Oral Daily  . folic acid  1 mg Oral Daily  . furosemide  40 mg Intravenous BID  . insulin aspart  0-5 Units Subcutaneous QHS  . insulin aspart  0-9 Units Subcutaneous TID WC  . metoprolol  5 mg Intravenous Q6H  . sodium chloride flush  3 mL Intravenous Q12H  . sodium polystyrene  60 g Rectal Once   Continuous Infusions: . sodium chloride       LOS: 1 day   CHIU, Scheryl Marten, MD [facility] Pager [phone]  If 7PM-7AM, please contact night-coverage [email or web address] Password [email or web address] 01/19/2017, 4:40 PM

## 2017-01-19 NOTE — Telephone Encounter (Signed)
Wrongly opended

## 2017-01-19 NOTE — Consult Note (Signed)
WOC Nurse wound consult note Reason for Consult: Consult requested for BLE.  Pt has patchy areas of partial thickness wounds and full thickness wounds which appear to be healing. Wound type: Left anterior calf; partial thickness wounds .5X.5X.1cm and 2X2X.1cm; both sites pink and moist, no odor, small amt yellow drainage Right anterior leg with healing full thickness wounds; 5X4X.1cm and .8X.8X.1cm, both pink and dry without odor or drainage.  Dressing procedure/placement/frequency: Foam dressings to protect and promote healing. Please re-consult if further assistance is needed.  Thank-you,  Cammie Mcgee MSN, RN, CWOCN, [place], CNS [phone]

## 2017-01-19 NOTE — Progress Notes (Signed)
CRITICAL VALUE ALERT  Critical value received:  INR 7.22  Date of notification:  01/19/2017  Time of notification:  0957  Critical value read back:Yes.    Nurse who received alert:  C.Hydie Langan,RN  MD notified (1st page):  Chiu,MD  Time of first page:  0958, MD on floor at time  MD notified (2nd page):  Time of second page:  Responding MD:   Time MD responded:

## 2017-01-19 NOTE — Consult Note (Signed)
Reason for Consult:   CHF  Requesting Physician: Dr Rhona Leavens Primary Cardiologist Dr Hester Mates Dr Joana Reamer [place]  HPI:  Bruce Price is a 76 y.o. male who is being seen today for the evaluation of CHF at the request of Dr Rhona Leavens.  The pt's histpry is obtained from records in [facility]- pt is unable to give any history and no family is present. The pt has a history of NICM. He had an abnormal stress test in Oct 2016. He had multiple cardiac risk factors including HTN, PAF, and DM.  He underwent coronary angiogram in [place] Oct 2016 tha revealed a 30% mLAD lesion but no other significant CAD. His EF was 25% at cath.   He then had a BiV pacemaker implanted in Aug 2017 after he presented to [facility] with bradycardia. Other medical history includes chronic venous edema and venous ulcer.  He was admitted to [facility] 01/18/17 as a transfer from [facility] in acute respiratory distress after he presented there with SOB. He was found to have a BNP of 5150, elevated LFTs,  an INR of 8, K+ 5.4, and a SCr of 3.0 (up from 1.7). Since admission he has received IV Lasix-no significant output yet.    PMHx:  Past Medical History:  Diagnosis Date  . Atrial fibrillation (HCC)   . Diabetes mellitus   . High blood pressure   . High cholesterol   . Systolic CHF [facility])     Past Surgical History:  Procedure Laterality Date  . HERNIA REPAIR    . Knee replacement    . TONSILLECTOMY      SOCHx:  reports that he quit smoking about 48 years ago. His smoking use included Cigarettes. He has a 60.00 pack-year smoking history. He has never used smokeless tobacco. He reports that he does not drink alcohol or use drugs.  FAMHx: Family History  Problem Relation Age of Onset  . Heart disease Mother   . Diabetes Mellitus II Mother     ALLERGIES: No Known Allergies  ROS: Review of Systems: Unable to obtain secondary to delerium   HOME MEDICATIONS: Prior to Admission medications   Medication Sig Start  Date End Date Taking? Authorizing Provider  benazepril (LOTENSIN) 5 MG tablet Take 5 mg by mouth daily. 12/20/16  Yes Historical Provider, MD  digoxin (LANOXIN) 0.125 MG tablet Take 125 mcg by mouth daily. 12/20/16  Yes Historical Provider, MD  fish oil-omega-3 fatty acids 1000 MG capsule Take 1 g by mouth daily.    Yes Historical Provider, MD  folic acid (FOLVITE) 1 MG tablet Take 1 mg by mouth daily.   Yes Historical Provider, MD  furosemide (LASIX) 40 MG tablet Take 40-80 mg by mouth 2 (two) times daily. 80 MG in the morning and 40 MG in the afternoon   Yes Historical Provider, MD  metoprolol tartrate (LOPRESSOR) 25 MG tablet Take 50 mg by mouth 2 (two) times daily. 12/20/16  Yes Historical Provider, MD  simvastatin (ZOCOR) 80 MG tablet Take 80 mg by mouth daily.   Yes Historical Provider, MD  warfarin (COUMADIN) 5 MG tablet Take 1 tablet by mouth daily. 01/03/17  Yes Historical Provider, MD    HOSPITAL MEDICATIONS: I have reviewed the patient's current medications.  VITALS: Blood pressure 120/88, pulse (!) 31, temperature 97.6 F (36.4 C), temperature source Axillary, resp. rate (!) 25, height 6' (1.829 m), weight 224 lb 3.3 oz (101.7 kg), SpO2 (!) 68 %.  PHYSICAL  EXAM: General appearance: combative, delirious, moderately obese, uncooperative and on facemask O2 Exophthalmus noted Neck: JVD noted Lungs: decreased breath sounds (poor effort) Heart: irregularly irregular rhythm Abdomen: not distended Extremities: venous stasis dermatitis noted and chronic LE skin changes and venous ulcers Pulses: diminnished Skin: cool, dry Neurologic: arousable but confused  LABS: Results for orders placed or performed during the hospital encounter of 01/18/17 (from the past 24 hour(s))  MRSA PCR Screening     Status: None   Collection Time: 01/18/17  9:47 PM  Result Value Ref Range   MRSA by PCR NEGATIVE NEGATIVE  Creatinine, urine, random     Status: None   Collection Time: 01/19/17 12:02 AM    Result Value Ref Range   Creatinine, Urine 146.57 mg/dL  Glucose, capillary     Status: Abnormal   Collection Time: 01/19/17 12:20 AM  Result Value Ref Range   Glucose-Capillary 158 (H) 65 - 99 mg/dL   Comment 1 Notify RN   Lactic acid, plasma     Status: Abnormal   Collection Time: 01/19/17 12:22 AM  Result Value Ref Range   Lactic Acid, Venous 4.5 (HH) 0.5 - 1.9 mmol/L  Troponin I (q 6hr x 3)     Status: Abnormal   Collection Time: 01/19/17 12:22 AM  Result Value Ref Range   Troponin I 0.06 (HH) <0.03 ng/mL  Protime-INR     Status: Abnormal   Collection Time: 01/19/17  6:55 AM  Result Value Ref Range   Prothrombin Time 64.2 (H) 11.4 - 15.2 seconds   INR 7.22 (HH)   Troponin I (q 6hr x 3)     Status: Abnormal   Collection Time: 01/19/17  6:55 AM  Result Value Ref Range   Troponin I 0.06 (HH) <0.03 ng/mL  Glucose, capillary     Status: Abnormal   Collection Time: 01/19/17  8:34 AM  Result Value Ref Range   Glucose-Capillary 155 (H) 65 - 99 mg/dL   Comment 1 Notify RN    Comment 2 Document in Chart   Digoxin level     Status: None   Collection Time: 01/19/17  8:58 AM  Result Value Ref Range   Digoxin Level 1.6 0.8 - 2.0 ng/mL  Basic metabolic panel     Status: Abnormal   Collection Time: 01/19/17  8:58 AM  Result Value Ref Range   Sodium 135 135 - 145 mmol/L   Potassium 5.5 (H) 3.5 - 5.1 mmol/L   Chloride 93 (L) 101 - 111 mmol/L   CO2 22 22 - 32 mmol/L   Glucose, Bld 147 (H) 65 - 99 mg/dL   BUN 67 (H) 6 - 20 mg/dL   Creatinine, Ser 1.61 (H) 0.61 - 1.24 mg/dL   Calcium 8.8 (L) 8.9 - 10.3 mg/dL   GFR calc non Af Amer 17 (L) >60 mL/min   GFR calc Af Amer 20 (L) >60 mL/min   Anion gap 20 (H) 5 - 15    EKG: pending  IMAGING: Dg Chest Port 1 View  Result Date: 01/19/2017 CLINICAL DATA:  Congestive heart failure. EXAM: PORTABLE CHEST 1 VIEW COMPARISON:  Radiographs of January 18, 2017. FINDINGS: Stable cardiomegaly. Atherosclerosis of thoracic aorta is noted. Left-sided  pacemaker is unchanged in position. Mild central pulmonary vascular congestion is noted. Minimal right pleural effusion is noted along right lateral chest wall. Degenerative changes seen involving both acromioclavicular joints. Left lung is clear. Mild right basilar subsegmental atelectasis is noted. IMPRESSION: Aortic atherosclerosis. Stable cardiomegaly with central  pulmonary vascular congestion. Minimal right pleural effusion is noted. Mild right basilar subsegmental atelectasis. Electronically Signed   By: Lupita Raider, M.D.   On: 01/19/2017 09:37    IMPRESSION:  1. Acute on chronic CHF- BNP at [facility] was reportedly > 5k. Difficult to tell by exam and currently no family available-? Home wgt  2. NICM- pt's EF was 25% at cath Oct 2016  3. PAF with RVR- Currently paced at high rate  4. CAD- 30% mLAD at cath Oct 2016 (Dr Dot Been in [facility]) after an abnormal stress   5. Acute on chronic renal insufficiency- SCr 1.08 in Nov 2016- 3.21.now  6. S/P Bi Pacemaker- [employer] Jude Bi V pacemaker by Dr Concha Norway in Aug 2017- (no ICD)  7. Chronic anticoagulation- CHA2Ds2 VASc=5, per Dr Concha Norway. Pt is on Coumadin with supra therapeutic INR on admission- 7.22  8. Chronic venous edema- chronic skin changes and LE venous ulcers   RECOMMENDATION: MD to see- he may need higher dose Lasix. Will check BNP here as well as Mg++ and TSH.  Stop ACE and Digoxin and hold Coumadin as you have done. Add low dose beta blocker for rate. Will interview his family when they arrive for more history.   Time Spent Directly with Patient: 50 minutes  Corine Shelter, [place]  [phone] beeper 01/19/2017, 10:10 AM

## 2017-01-19 NOTE — Progress Notes (Signed)
ANTICOAGULATION CONSULT NOTE  Pharmacy Consult for warfarin Indication: atrial fibrillation  No Known Allergies  Patient Measurements: Height: 6' (182.9 cm) (per pt) Weight: 224 lb 3.3 oz (101.7 kg) IBW/kg (Calculated) : 77.6  Vital Signs: Temp: 97.1 F (36.2 C) (04/25 0544) Temp Source: Axillary (04/25 0544) BP: 116/71 (04/25 0024) Pulse Rate: 112 (04/24 2029)  Labs:  Recent Labs  01/19/17 0022  TROPONINI 0.06*    CrCl cannot be calculated (Patient's most recent lab result is older than the maximum 21 days allowed.).   Medical History: Past Medical History:  Diagnosis Date  . Atrial fibrillation (HCC)   . Diabetes mellitus   . High blood pressure   . High cholesterol   . Systolic CHF (HCC)     Medications:  Prescriptions Prior to Admission  Medication Sig Dispense Refill Last Dose  . benazepril (LOTENSIN) 5 MG tablet Take 5 mg by mouth daily.  0 01/17/2017 at Unknown time  . digoxin (LANOXIN) 0.125 MG tablet Take 125 mcg by mouth daily.  0 01/17/2017 at Unknown time  . fish oil-omega-3 fatty acids 1000 MG capsule Take 1 g by mouth daily.    01/17/2017 at Unknown time  . folic acid (FOLVITE) 1 MG tablet Take 1 mg by mouth daily.   01/17/2017 at Unknown time  . furosemide (LASIX) 40 MG tablet Take 40-80 mg by mouth 2 (two) times daily. 80 MG in the morning and 40 MG in the afternoon   01/17/2017 at Unknown time  . metoprolol tartrate (LOPRESSOR) 25 MG tablet Take 50 mg by mouth 2 (two) times daily.  0 01/17/2017 at Unknown time  . simvastatin (ZOCOR) 80 MG tablet Take 80 mg by mouth daily.   01/17/2017 at Unknown time  . warfarin (COUMADIN) 5 MG tablet Take 1 tablet by mouth daily.   01/17/2017 at Unknown time   Scheduled:  . doxazosin  4 mg Oral Daily  . folic acid  1 mg Oral Daily  . furosemide  40 mg Intravenous BID  . insulin aspart  0-5 Units Subcutaneous QHS  . insulin aspart  0-9 Units Subcutaneous TID WC  . metoprolol tartrate  25 mg Oral BID  . sodium  chloride flush  3 mL Intravenous Q12H   Infusions:  . sodium chloride      Assessment: 68 yoM with PMH AFib, CHF w/ AICD, HTN, HLD, DM, gout, chronic venous insufficiency of legs, who presents with SOB, LE edema, and oliguria. Initially presented to [facility] where INR was found to supratherapeutic. Patient later transferred to [facility] for AECHF. Pharmacy consulted to dose warfarin while admitted   Baseline INR elevated at 7.2  Prior anticoagulation: warfarin 5 mg daily  Significant events:  Today, 01/19/2017:  CBC: Hgb WNL at [facility]; Plt not reported  INR supratherapeutic and transaminases/bili elevated, likely d/t hepatic congestion from [facility] or gallstone, though no signs of cholecystitis  Major drug interactions: none  No bleeding issues per nursing  NPO currently  Goal of Therapy: INR 2-3  Plan:  No warfarin tonight  Daily INR  CBC at least q72 hr while on warfarin  Monitor for signs of bleeding or thrombosis   Bernadene Person, PharmD Pager: [phone] 01/19/2017, 7:55 AM

## 2017-01-19 NOTE — Progress Notes (Addendum)
Discussed the case with Dr. Haroldine Laws (advanced CHF service). Given the family's observation that the patient has been declining over the past year, but particularly after placement of a Bi-V pacemaker and with new renal failure and exam findings of low output heart failure, he would not suggest aggressive measures (such as inotrope therapy, aggressive diuresis or other measures). At best, this may temporarily get him back to a baseline he was at only a month or so ago. Labs indicate multi-organ system dysfunction likely d/t cardiogenic shock. I met with the family and Dr. Wyline Copas and we agreed not to pursue aggressive measures. We will attempt high dose diuretics, however, so far he has been significantly oliguric. We may need to further discuss hospice and comfort measures as an option tomorrow. He is DNR.  Prolonged patient contact (directly, with family and in phone consultation with colleagues): 30 minutes  Pixie Casino, MD, [place]  Attending Cardiologist  Direct Dial: [phone]  Fax: [phone]  Website:  [email or web address]

## 2017-01-19 NOTE — Progress Notes (Signed)
OT Cancellation Note  Patient Details Name: Bruce Price MRN: [id number] DOB: 1941-08-26   Cancelled Treatment:    Reason Eval/Treat Not Completed: Medical issues which prohibited therapy.  Pt with increased work of breathing this am. Will check back either later today or tomorrow.  Delsa Walder 01/19/2017, 10:44 AM  Marica Otter, OTR/L [phone] 01/19/2017

## 2017-01-19 NOTE — Progress Notes (Signed)
PT Cancellation Note  Patient Details Name: Bruce Price MRN: [id number] DOB: [birth year]   Cancelled Treatment:     PT order received but eval deferred at request of RN - RN reports pt with decreased sats and increased WOB.  Will follow.  Pg [phone] [place] Luppino 01/19/2017, 11:57 AM

## 2017-01-19 NOTE — Care Management Note (Addendum)
Case Management Note  Patient Details  Name: Bruce Price MRN: [id number] Date of Birth: [birth year]  Subjective/Objective:      Heart failure              Action/Plan:     From home Date:  January 19, 2017 Chart reviewed for concurrent status and case management needs. Will continue to follow patient progress. Discharge Planning: Heart Failure Screening/advance hhc notified. Expected discharge date: [id number] Marcelle Smiling, BSN, [place], [place]   [phone]  Expected Discharge Date:  01/22/17               Expected Discharge Plan:  Home w Home Health Services  In-House Referral:     Discharge planning Services  CM Consult  Post Acute Care Choice:  Home Health Choice offered to:  Patient  DME Arranged:    DME Agency:     HH Arranged:  Disease Management HH Agency:  [facility]  Status of Service:  In process, will continue to follow  If discussed at Long Length of Stay Meetings, dates discussed:    Additional Comments:  [name], RN 01/19/2017, 8:53 AM

## 2017-01-20 ENCOUNTER — Inpatient Hospital Stay (HOSPITAL_COMMUNITY): Payer: Medicare Other

## 2017-01-20 DIAGNOSIS — D689 Coagulation defect, unspecified: Secondary | ICD-10-CM

## 2017-01-20 DIAGNOSIS — M7989 Other specified soft tissue disorders: Secondary | ICD-10-CM

## 2017-01-20 LAB — GLUCOSE, CAPILLARY
GLUCOSE-CAPILLARY: 112 mg/dL — AB (ref 65–99)
GLUCOSE-CAPILLARY: 203 mg/dL — AB (ref 65–99)
Glucose-Capillary: 105 mg/dL — ABNORMAL HIGH (ref 65–99)
Glucose-Capillary: 204 mg/dL — ABNORMAL HIGH (ref 65–99)

## 2017-01-20 LAB — HEPATITIS PANEL, ACUTE
HCV Ab: <0.1 s/co ratio (ref 0.0–0.9)
HEP A IGM: NEGATIVE
HEP B C IGM: NEGATIVE
Hepatitis B Surface Ag: NEGATIVE

## 2017-01-20 LAB — BASIC METABOLIC PANEL
ANION GAP: 12 (ref 5–15)
BUN: 70 mg/dL — ABNORMAL HIGH (ref 6–20)
CALCIUM: 8.2 mg/dL — AB (ref 8.9–10.3)
CO2: 27 mmol/L (ref 22–32)
Chloride: 96 mmol/L — ABNORMAL LOW (ref 101–111)
Creatinine, Ser: 3.04 mg/dL — ABNORMAL HIGH (ref 0.61–1.24)
GFR calc Af Amer: 21 mL/min — ABNORMAL LOW (ref >60)
GFR, EST NON AFRICAN AMERICAN: 18 mL/min — AB (ref >60)
GLUCOSE: 107 mg/dL — AB (ref 65–99)
Potassium: 5.3 mmol/L — ABNORMAL HIGH (ref 3.5–5.1)
SODIUM: 135 mmol/L (ref 135–145)

## 2017-01-20 LAB — ECHOCARDIOGRAM COMPLETE
Height: 72 in
WEIGHTICAEL: 3566.16 [oz_av]

## 2017-01-20 LAB — CBC
HCT: 40.7 % (ref 39.0–52.0)
Hemoglobin: 13.5 g/dL (ref 13.0–17.0)
MCH: 24 pg — AB (ref 26.0–34.0)
MCHC: 33.2 g/dL (ref 30.0–36.0)
MCV: 72.4 fL — ABNORMAL LOW (ref 78.0–100.0)
PLATELETS: 237 10*3/uL (ref 150–400)
RBC: 5.62 MIL/uL (ref 4.22–5.81)
RDW: 16.6 % — ABNORMAL HIGH (ref 11.5–15.5)
WBC: 13.2 10*3/uL — ABNORMAL HIGH (ref 4.0–10.5)

## 2017-01-20 LAB — UREA NITROGEN, URINE: UREA NITROGEN UR: 377 mg/dL

## 2017-01-20 LAB — PROTIME-INR
INR: 5.93
PROTHROMBIN TIME: 54.9 s — AB (ref 11.4–15.2)

## 2017-01-20 LAB — HEMOGLOBIN A1C
HEMOGLOBIN A1C: 10 % — AB (ref 4.8–5.6)
Mean Plasma Glucose: 240 mg/dL

## 2017-01-20 LAB — MAGNESIUM: MAGNESIUM: 2.3 mg/dL (ref 1.7–2.4)

## 2017-01-20 LAB — TSH: TSH: 1.38 u[IU]/mL (ref 0.350–4.500)

## 2017-01-20 MED ORDER — METOPROLOL TARTRATE 25 MG PO TABS
25.0000 mg | ORAL_TABLET | Freq: Two times a day (BID) | ORAL | Status: DC
  Administered 2017-01-20 – 2017-01-21 (×4): 25 mg via ORAL
  Filled 2017-01-20 (×4): qty 1

## 2017-01-20 MED ORDER — FUROSEMIDE 10 MG/ML IJ SOLN
80.0000 mg | Freq: Two times a day (BID) | INTRAMUSCULAR | Status: DC
  Administered 2017-01-20 – 2017-01-22 (×5): 80 mg via INTRAVENOUS
  Filled 2017-01-20 (×5): qty 8

## 2017-01-20 NOTE — Progress Notes (Signed)
Notified Junior RN in regards to patient's INR level of 5.93.

## 2017-01-20 NOTE — Progress Notes (Signed)
ANTICOAGULATION CONSULT NOTE  Pharmacy Consult for warfarin Indication: atrial fibrillation  No Known Allergies  Patient Measurements: Height: 6' (182.9 cm) (per pt) Weight: 222 lb 14.2 oz (101.1 kg) IBW/kg (Calculated) : 77.6  Vital Signs: Temp: 96.8 F (36 C) (04/26 0328) Temp Source: Axillary (04/26 0328) BP: 125/61 (04/26 0600) Pulse Rate: 60 (04/26 0600)  Labs:  Recent Labs  01/19/17 0022 01/19/17 0655 01/19/17 0858 01/19/17 1130 01/20/17 0425 01/20/17 0525  HGB  --   --   --   --  13.5  --   HCT  --   --   --   --  40.7  --   PLT  --   --   --   --  237  --   LABPROT  --  64.2*  --   --   --  54.9*  INR  --  7.22*  --   --   --  5.93*  CREATININE  --   --  3.21*  --  3.04*  --   TROPONINI 0.06* 0.06*  --  0.06*  --   --     Estimated Creatinine Clearance: 25 mL/min (A) (by C-G formula based on SCr of 3.04 mg/dL (H)).   Medical History: Past Medical History:  Diagnosis Date  . Atrial fibrillation (HCC)   . Diabetes mellitus   . High blood pressure   . High cholesterol   . Systolic CHF (HCC)     Medications:  Prescriptions Prior to Admission  Medication Sig Dispense Refill Last Dose  . benazepril (LOTENSIN) 5 MG tablet Take 5 mg by mouth daily.  0 01/17/2017 at Unknown time  . digoxin (LANOXIN) 0.125 MG tablet Take 125 mcg by mouth daily.  0 01/17/2017 at Unknown time  . fish oil-omega-3 fatty acids 1000 MG capsule Take 1 g by mouth daily.    01/17/2017 at Unknown time  . folic acid (FOLVITE) 1 MG tablet Take 1 mg by mouth daily.   01/17/2017 at Unknown time  . furosemide (LASIX) 40 MG tablet Take 40-80 mg by mouth 2 (two) times daily. 80 MG in the morning and 40 MG in the afternoon   01/17/2017 at Unknown time  . metoprolol tartrate (LOPRESSOR) 25 MG tablet Take 50 mg by mouth 2 (two) times daily.  0 01/17/2017 at Unknown time  . simvastatin (ZOCOR) 80 MG tablet Take 80 mg by mouth daily.   01/17/2017 at Unknown time  . warfarin (COUMADIN) 5 MG tablet Take 1  tablet by mouth daily.   01/17/2017 at Unknown time   Scheduled:  . doxazosin  4 mg Oral Daily  . folic acid  1 mg Oral Daily  . furosemide  40 mg Intravenous BID  . insulin aspart  0-5 Units Subcutaneous QHS  . insulin aspart  0-9 Units Subcutaneous TID WC  . metoprolol  5 mg Intravenous Q6H  . sodium chloride flush  3 mL Intravenous Q12H   Infusions:  . sodium chloride      Assessment: 73 yoM with PMH AFib, CHF w/ AICD, HTN, HLD, DM, gout, chronic venous insufficiency of legs, who presents with SOB, LE edema, and oliguria. Initially presented to [facility] where INR was found to supratherapeutic. Patient later transferred to [facility] SDU for AECHF. Pharmacy consulted to dose warfarin while admitted   Baseline INR elevated at 7.2  Prior anticoagulation: warfarin 5 mg daily  Significant events:  Today, 01/20/2017:  CBC: WNL   INR remains supratherapeutic at 5.93,  but trending down.  Elevation likely d/t hepatic congestion from AECHF.  Major drug interactions: none  No bleeding issues per nursing  NPO currently  Goal of Therapy: INR 2-3  Plan:  Continue to hold warfarin tonight  Daily INR  Monitor for signs of bleeding or thrombosis  Junita Push, PharmD, BCPS Pager: [phone] 01/20/2017, 8:03 AM

## 2017-01-20 NOTE — Progress Notes (Signed)
PROGRESS NOTE    Bruce Price  UUE:[id number] DOB: 01/26/1941 DOA: 01/18/2017 PCP: Eloisa Northern, MD    Brief Narrative:  76 y.o. male with medical history significant of sCHF (per EDP, Dr. Drue Second, EF is 25%, no 2d ehco available in Epic), s/p of AICD, hypertension, hyperlipidemia, diabetes mellitus, gout, chronic venous insufficiency in legs, who presents with shortness of breath, leg edema, orthopnea, decreased urine output.  Pt is transferred from [facility] per EDP, Dr. Drue Second.  Pt states that he has been having shortness of breath, leg edema, orthopnea, decreased urine output for several days, which has been progressively getting worse today. Patient has dry cough, no chest pain, fever or chills. Has nausea, no vomiting, diarrhea, abdominal pain or symptoms of UTI. No unilateral weakness, slurred speech or facial droop. No hematuria, hematemesis or hematochezia.  Patient was initially evaluated in [facility]. He was found to have BNP 51400, troponin 0.10, digoix 2.1 (up normal limit is 2.0), WBC 9.8, hemoglobin 14.8, INR 8.0, lactic acid 7.1, negative urinalysis, potassium 5.4, creatinine is up from 1.7 to 3.0 per EDP, bicarbonate 18, AG 30, abnormal liver functions with ALP and 22, AST 191, ALT 113, total bilirubin 2.3, temperature normal, tachycardia with heart rate 112, respiration rate 18, oxygen saturation 95% on room air, blood pressure 176/69. Pt received 600 cc of NS in ED, his lactic acid level decreased from 7.1-->5.0.  Assessment & Plan:   Principal Problem:   Cardiogenic shock (HCC) Active Problems:   Essential hypertension   High cholesterol   Diabetes mellitus (HCC)   Atrial fibrillation (HCC)   Systolic CHF (HCC)   Acute on chronic systolic CHF (congestive heart failure) (HCC)   Elevated lactic acid level   Acute on chronic kidney failure (HCC)   Coagulopathy (HCC)   Abnormal LFTs   Acute respiratory distress   Chronic venous insufficiency  Presence of biventricular cardiac pacemaker   CHF exacerbation (HCC)   Cardiorenal syndrome with renal failure  Acute respiratory distress due to acute on chronic systolic CHF: Patient has elevated BNP 51400, bilateral leg edema and positive JVD, consistent with CHF exacerbation. Pt reported to reported to have EF is 25%. - Cardiology consulted. Appreciate in put. Recommendation to increase dose of lasix -Initially on Lasix 40 mg bid by IV, dose increased by Cardiology to 80mg  bid -1L urine out overnight - This AM, patient appears improved with improved O2 requirements - Metoprolol 25mg  bid added by Cardiology - Repeat bmet in AM  Atrial Fibrillation and Coagulopathy due to coumadin: CHA2DS2-VASc Score is 5 , needs oral anticoagulation. Patient is on Coumadin at home. INR is 8.0 in ED. Heart rate is ~110s. No bleeding tendency.  -Cont to hold coumadin, pharmacy to dose - INR currently 5.93 -continue beta blocker as above -holding digoxin  HTN: current bp is 136/69  -Will continue metoprolol and doxazosin per above -Will continue with metoprolol per above  HLD: Last LDL was not on record -Held zocor due to abnormal liver function  DM-II: Last A1c not on record. Pt used to be on levemir, but currently patient is not taking meds at home (no DM meds in his med list from [place]). CBG 190. -Will continue SSI coverage  Elevated lactic acid level: 7.1-->5.0. No signs of infection.  - Suspect lactic acidosis secondary to tissue hypoxia  - Stable at present  Acute on chronic CKD:  - Outpt labs reviewed. Baseline Cr of around 1-1.5 - BUN/Cr elevated - Given concerns of volume  overload, plan to continue aggressive diuresis. - Cr has improved slightly with lasix. - Repeat bmet in AM  Abnormal LFTs: abnormal liver functions with ALP and 22, AST 191, ALT 113, total bilirubin 2.3. US-RUQ showed gallstone 2.5 cm, gallbladder wall thickening 26.5, this could be from hypoproteinemia per  radiologist. Cholecystitis cannot be excluded completely. Mild ascites. Pt dose not have fever or leukocytosis or AP, dose not seem to have cholecystitis -Possible hepatic congestion secondary to acute heart failure -Holding Zocor  Chronic venous insufficiency and skin lesions in legs. -consulted to wound care. - Recommendations noted  Hyperkalemia: No EKG change. -Did not tolerate kayexelate enema - Continue with aggressive diuresis for now  LUE DVT with arm swelling  - Noted today - LUE DVT noted on UE doppler - Cont with coumadin per pharmacy dosing  DVT prophylaxis: coumadin Code Status: DNR Family Communication: Pt in room, updated pt's son and brother Disposition Plan: Uncertain at this time  Consultants:   Cardiology  Procedures:     Antimicrobials: Anti-infectives    None      Subjective: Denies chest pain. States breathing better  Objective: Vitals:   01/20/17 0800 01/20/17 1000 01/20/17 1200 01/20/17 1400  BP: 136/61 (!) 112/56 (!) 113/46 116/61  Pulse: (!) 59 69 (!) 59 (!) 59  Resp: (!) 30 18 (!) 25 (!) 23  Temp:   97.4 F (36.3 C)   TempSrc:   Oral   SpO2: 100% 100% 100% 95%  Weight:      Height:        Intake/Output Summary (Last 24 hours) at 01/20/17 1403 Last data filed at 01/20/17 1100  Gross per 24 hour  Intake              240 ml  Output             1770 ml  Net            -1530 ml   Filed Weights   01/18/17 2215 01/19/17 0544 01/20/17 0500  Weight: 101.1 kg (222 lb 14.2 oz) 101.7 kg (224 lb 3.3 oz) 101.1 kg (222 lb 14.2 oz)    Examination:  General exam: Awake, laying in bed, in nad Respiratory system: normal resp effort, no audible wheezing Cardiovascular system: regular rate, s1, s2 Gastrointestinal system: soft, nondistended, pos BS, nontender Central nervous system: cn2-12 grossly intact, strength intact Extremities: perfused, no clubbing Skin: normal skin turgor, no notable skin lesions seen Psychiatry: mood normal//  no visual hallucinations  Data Reviewed: I have personally reviewed following labs and imaging studies  CBC:  Recent Labs Lab 01/20/17 0425  WBC 13.2*  HGB 13.5  HCT 40.7  MCV 72.4*  PLT 237   Basic Metabolic Panel:  Recent Labs Lab 01/19/17 0858 01/20/17 0425  NA 135 135  K 5.5* 5.3*  CL 93* 96*  CO2 22 27  GLUCOSE 147* 107*  BUN 67* 70*  CREATININE 3.21* 3.04*  CALCIUM 8.8* 8.2*  MG  --  2.3   GFR: Estimated Creatinine Clearance: 25 mL/min (A) (by C-G formula based on SCr of 3.04 mg/dL (H)). Liver Function Tests: No results for input(s): AST, ALT, ALKPHOS, BILITOT, PROT, ALBUMIN in the last 168 hours. No results for input(s): LIPASE, AMYLASE in the last 168 hours. No results for input(s): AMMONIA in the last 168 hours. Coagulation Profile:  Recent Labs Lab 01/19/17 0655 01/20/17 0525  INR 7.22* 5.93*   Cardiac Enzymes:  Recent Labs Lab 01/19/17 0022 01/19/17 0655 01/19/17  1130  TROPONINI 0.06* 0.06* 0.06*   BNP (last 3 results) No results for input(s): PROBNP in the last 8760 hours. HbA1C:  Recent Labs  01/19/17 0858  HGBA1C 10.0*   CBG:  Recent Labs Lab 01/19/17 1207 01/19/17 1639 01/19/17 2130 01/20/17 0746 01/20/17 1231  GLUCAP 129* 168* 131* 105* 112*   Lipid Profile:  Recent Labs  01/19/17 0858  CHOL 103  HDL 19*  LDLCALC 54  TRIG 161*  CHOLHDL 5.4   Thyroid Function Tests:  Recent Labs  01/20/17 0525  TSH 1.380   Anemia Panel: No results for input(s): VITAMINB12, FOLATE, FERRITIN, TIBC, IRON, RETICCTPCT in the last 72 hours. Sepsis Labs:  Recent Labs Lab 01/19/17 0022  LATICACIDVEN 4.5*    Recent Results (from the past 240 hour(s))  MRSA PCR Screening     Status: None   Collection Time: 01/18/17  9:47 PM  Result Value Ref Range Status   MRSA by PCR NEGATIVE NEGATIVE Final    Comment:        The GeneXpert MRSA Assay (FDA approved for NASAL specimens only), is one component of a comprehensive MRSA  colonization surveillance program. It is not intended to diagnose MRSA infection nor to guide or monitor treatment for MRSA infections.      Radiology Studies: Dg Chest Port 1 View  Result Date: 01/19/2017 CLINICAL DATA:  Congestive heart failure. EXAM: PORTABLE CHEST 1 VIEW COMPARISON:  Radiographs of January 18, 2017. FINDINGS: Stable cardiomegaly. Atherosclerosis of thoracic aorta is noted. Left-sided pacemaker is unchanged in position. Mild central pulmonary vascular congestion is noted. Minimal right pleural effusion is noted along right lateral chest wall. Degenerative changes seen involving both acromioclavicular joints. Left lung is clear. Mild right basilar subsegmental atelectasis is noted. IMPRESSION: Aortic atherosclerosis. Stable cardiomegaly with central pulmonary vascular congestion. Minimal right pleural effusion is noted. Mild right basilar subsegmental atelectasis. Electronically Signed   By: Lupita Raider, M.D.   On: 01/19/2017 09:37    Scheduled Meds: . doxazosin  4 mg Oral Daily  . folic acid  1 mg Oral Daily  . furosemide  80 mg Intravenous BID  . insulin aspart  0-5 Units Subcutaneous QHS  . insulin aspart  0-9 Units Subcutaneous TID WC  . metoprolol tartrate  25 mg Oral BID  . sodium chloride flush  3 mL Intravenous Q12H   Continuous Infusions: . sodium chloride       LOS: 2 days   CHIU, Scheryl Marten, MD [facility] Pager [phone]  If 7PM-7AM, please contact night-coverage [email or web address] Password [email or web address] 01/20/2017, 2:03 PM

## 2017-01-20 NOTE — Progress Notes (Signed)
Inpatient Diabetes Program Recommendations  AACE/ADA: New Consensus Statement on Inpatient Glycemic Control (2015)  Target Ranges:  Prepandial:   less than 140 mg/dL      Peak postprandial:   less than 180 mg/dL (1-2 hours)      Critically ill patients:  140 - 180 mg/dL   Lab Results  Component Value Date   GLUCAP 105 (H) 01/20/2017   HGBA1C 10.0 (H) 01/19/2017    Review of Glycemic Control  Diabetes history: DM2 Outpatient Diabetes medications: None - previously on Levemir 100 units QD Current orders for Inpatient glycemic control: Novolog 0-9 units tidwc and hs  Blood sugars well-controlled.  Pt will need to check blood sugars at home and take logbook to PCP for review.  HgbA1C of 10.% indicates average blood sugar of 240 mg/dL. Will order Living Well With Diabetes book for pt.  Agree with orders. Blood sugars trending well. Will continue to follow.  Thank you. Ailene Ards, RD, LDN, CDE Inpatient Diabetes Coordinator [phone]

## 2017-01-20 NOTE — Progress Notes (Signed)
*  PRELIMINARY RESULTS* Vascular Ultrasound Left upper extremity venous duplex has been completed.  Preliminary findings: Occlusive DVT noted in the Left subclavian, axillary, brachial, proximal radial, and proximal ulnar veins.  Superficial thrombosis noted in the left basilic vein.  Gave results to pt nurse.  Farrel Demark, RDMS, RVT  01/20/2017, 10:45 AM

## 2017-01-20 NOTE — Progress Notes (Signed)
PT Cancellation Note  Patient Details Name: Bruce Price MRN: [id number] DOB: 02/03/1941   Cancelled Treatment:    Reason Eval/Treat Not Completed: Medical issues which prohibited therapy (Dopplers showed New occlusive DVT LUE, RN awaiting orders. Noted pt to have elevated INR. Will follow. )   Tamala Ser 01/20/2017, 11:55 AM [phone]

## 2017-01-20 NOTE — Progress Notes (Signed)
Progress Note  Patient Name: Bruce Price Date of Encounter: 01/20/2017  Primary Cardiologist: Dr Curlene Labrum  Subjective   Pt awake and alert this am. Still c/o of some SOB but he looks comfortable in bed.  Inpatient Medications    Scheduled Meds: . doxazosin  4 mg Oral Daily  . folic acid  1 mg Oral Daily  . furosemide  40 mg Intravenous BID  . insulin aspart  0-5 Units Subcutaneous QHS  . insulin aspart  0-9 Units Subcutaneous TID WC  . metoprolol  5 mg Intravenous Q6H  . sodium chloride flush  3 mL Intravenous Q12H   Continuous Infusions: . sodium chloride     PRN Meds: sodium chloride, ondansetron (ZOFRAN) IV, sodium chloride flush   Vital Signs    Vitals:   01/20/17 0328 01/20/17 0400 01/20/17 0500 01/20/17 0600  BP:  122/62  125/61  Pulse:  (!) 59  60  Resp:  (!) 30  (!) 34  Temp: (!) 96.8 F (36 C)     TempSrc: Axillary     SpO2:  100%  100%  Weight:   222 lb 14.2 oz (101.1 kg)   Height:        Intake/Output Summary (Last 24 hours) at 01/20/17 0825 Last data filed at 01/20/17 0758  Gross per 24 hour  Intake                0 ml  Output              970 ml  Net             -970 ml   Filed Weights   01/18/17 2215 01/19/17 0544 01/20/17 0500  Weight: 222 lb 14.2 oz (101.1 kg) 224 lb 3.3 oz (101.7 kg) 222 lb 14.2 oz (101.1 kg)    Telemetry     paced, short runs of NSVT- Personally Reviewed  ECG    NSR-tracking A, pacing V - Personally Reviewed  Physical Exam   GEN: No acute distress.  Awake and alert, poor dentition Neck: No JVD Cardiac: RRR, no murmurs, rubs, or gallops.  Respiratory:bilateral rales GI: Soft, nontender, non-distended  MS: 1+ edema Neuro:  Nonfocal  Psych: Normal affect   Labs    Chemistry Recent Labs Lab 01/19/17 0858 01/20/17 0425  NA 135 135  K 5.5* 5.3*  CL 93* 96*  CO2 22 27  GLUCOSE 147* 107*  BUN 67* 70*  CREATININE 3.21* 3.04*  CALCIUM 8.8* 8.2*  GFRNONAA 17* 18*  GFRAA 20* 21*  ANIONGAP 20* 12       Hematology Recent Labs Lab 01/20/17 0425  WBC 13.2*  RBC 5.62  HGB 13.5  HCT 40.7  MCV 72.4*  MCH 24.0*  MCHC 33.2  RDW 16.6*  PLT 237    Cardiac Enzymes Recent Labs Lab 01/19/17 0022 01/19/17 0655 01/19/17 1130  TROPONINI 0.06* 0.06* 0.06*   No results for input(s): TROPIPOC in the last 168 hours.   BNP Recent Labs Lab 01/19/17 1130  BNP 3,881.3*     DDimer No results for input(s): DDIMER in the last 168 hours.   Radiology    Dg Chest Port 1 View  Result Date: 01/19/2017 CLINICAL DATA:  Congestive heart failure. EXAM: PORTABLE CHEST 1 VIEW COMPARISON:  Radiographs of January 18, 2017. FINDINGS: Stable cardiomegaly. Atherosclerosis of thoracic aorta is noted. Left-sided pacemaker is unchanged in position. Mild central pulmonary vascular congestion is noted. Minimal right pleural effusion is noted along right  lateral chest wall. Degenerative changes seen involving both acromioclavicular joints. Left lung is clear. Mild right basilar subsegmental atelectasis is noted. IMPRESSION: Aortic atherosclerosis. Stable cardiomegaly with central pulmonary vascular congestion. Minimal right pleural effusion is noted. Mild right basilar subsegmental atelectasis. Electronically Signed   By: Lupita Raider, M.D.   On: 01/19/2017 09:37    Cardiac Studies   Echo 01/19/17 pending  Patient Profile     76 y.o. male history of NICM and PAF on Coumadin. He had a cath in 2016- 30% mLAD 25%. He then had a BiV pacemaker implanted in Aug 2017 after he presented to [facility] with bradycardia. Other medical history includes chronic venous edema and venous ulcer.  He was admitted to [facility]/18/17 as a transfer from [facility] in acute respiratory distress after he presented there with SOB. He was found to have a BNP of 5150, elevated LFTs,  an INR of 8, K+ 5.4, and a SCr of 3.0 (up from 1.7). Since admission he has only diuresed about 1L but his mental status has improved. He is a DNR and aggressive measures  (IV inotropics) are not felt to be appropriate.  Assessment & Plan     1. Acute on chronic CHF- Will push diuretics. Pt is DNR, no aggressive measures  2. NICM- pt's EF was 25% at cath Oct 2016  3. PAF with RVR- rate controlled  4. CAD- 30% mLAD at cath Oct 2016 (Dr Dot Been in [facility]) after an abnormal stress   5. Acute on chronic renal insufficiency- SCr 1.08 in Nov 2016- 3.21. On admission- 3.0 today  6. S/P Bi Pacemaker- [employer] Bi V pacemaker by Dr Concha Norway in Aug 2017- (no ICD)  7. Chronic anticoagulation- CHA2Ds2 VASc=5, per Dr Concha Norway. Pt is on Coumadin with supra therapeutic INR on admission- 7.22, down to 5.9 today  8. Chronic venous edema- chronic   Plan: Increase Lasix to 80 mg IV BID. Change Lopressor to 25 PO mg BID.   Signed, Corine Shelter, PA-C  01/20/2017, 8:25 AM

## 2017-01-20 NOTE — Progress Notes (Signed)
OT Cancellation Note  Patient Details Name: Bruce Price MRN: [id number] DOB: [birth year]   Cancelled Treatment:    Reason Eval/Treat Not Completed: Medical issues which prohibited therapy. Awaiting doppler.  Also INR increased this am.  RN will page me later if pt can be seen.    Marcina Kinnison 01/20/2017, 7:57 AM  Marica Otter, OTR/L [phone] 01/20/2017

## 2017-01-20 NOTE — Progress Notes (Signed)
PT Cancellation Note  Patient Details Name: Bruce Price MRN: [id number] DOB: Mar 12, 1941   Cancelled Treatment:    Reason Eval/Treat Not Completed: Medical issues which prohibited therapy (doppler's pending, INR elevated. Will follow. )   Tamala Ser 01/20/2017, 8:50 AM  [phone]

## 2017-01-21 ENCOUNTER — Inpatient Hospital Stay (HOSPITAL_COMMUNITY): Payer: Medicare Other

## 2017-01-21 DIAGNOSIS — I82A22 Chronic embolism and thrombosis of left axillary vein: Secondary | ICD-10-CM

## 2017-01-21 LAB — BASIC METABOLIC PANEL
Anion gap: 13 (ref 5–15)
BUN: 70 mg/dL — AB (ref 6–20)
CALCIUM: 8.3 mg/dL — AB (ref 8.9–10.3)
CHLORIDE: 97 mmol/L — AB (ref 101–111)
CO2: 30 mmol/L (ref 22–32)
CREATININE: 2.32 mg/dL — AB (ref 0.61–1.24)
GFR, EST AFRICAN AMERICAN: 30 mL/min — AB (ref >60)
GFR, EST NON AFRICAN AMERICAN: 25 mL/min — AB (ref >60)
Glucose, Bld: 132 mg/dL — ABNORMAL HIGH (ref 65–99)
Potassium: 3.8 mmol/L (ref 3.5–5.1)
SODIUM: 140 mmol/L (ref 135–145)

## 2017-01-21 LAB — PROTIME-INR
INR: 6.09 — AB
Prothrombin Time: 56 seconds — ABNORMAL HIGH (ref 11.4–15.2)

## 2017-01-21 LAB — GLUCOSE, CAPILLARY
GLUCOSE-CAPILLARY: 123 mg/dL — AB (ref 65–99)
GLUCOSE-CAPILLARY: 176 mg/dL — AB (ref 65–99)
Glucose-Capillary: 243 mg/dL — ABNORMAL HIGH (ref 65–99)
Glucose-Capillary: 193 mg/dL — ABNORMAL HIGH (ref 65–99)

## 2017-01-21 MED ORDER — TECHNETIUM TO 99M ALBUMIN AGGREGATED
4.0000 | Freq: Once | INTRAVENOUS | Status: AC | PRN
  Administered 2017-01-21: 4.05 via INTRAVENOUS

## 2017-01-21 MED ORDER — TECHNETIUM TC 99M DIETHYLENETRIAME-PENTAACETIC ACID
32.8000 | Freq: Once | INTRAVENOUS | Status: AC | PRN
  Administered 2017-01-21: 32.8 via INTRAVENOUS

## 2017-01-21 NOTE — Evaluation (Signed)
Physical Therapy Evaluation Patient Details Name: Bruce Price MRN: [id number] DOB: [birth year] Today's Date: 01/21/2017   History of Present Illness  76 y.o. male with medical history significant of sCHF (per EDP, Dr. Drue Second, EF is 25%, no 2d ehco available in Epic), s/p of AICD, hypertension, hyperlipidemia, diabetes mellitus, gout, chronic venous insufficiency in legs, who presents with shortness of breath, leg edema, orthopnea, decreased urine output. Dx of respiratory distress, acute on chronic CHF, elevated INR, occlusive LUE DVT (likely due to pacemaker implantation per MD).   Clinical Impression  Pt admitted with above diagnosis. Pt currently with functional limitations due to the deficits listed below (see PT Problem List). Mod assist for supine to sit and for transfer. Gait deferred 2* elevated INR & high fall risk. SNF recommended.  Pt will benefit from skilled PT to increase their independence and safety with mobility to allow discharge to the venue listed below.       Follow Up Recommendations SNF    Equipment Recommendations  Other (comment) (TBD @ next venue)    Recommendations for Other Services       Precautions / Restrictions Precautions Precautions: Fall Precaution Comments: 1 fall 3 months ago, tends to slide out of bed to floor per family Restrictions Weight Bearing Restrictions: No      Mobility  Bed Mobility Overal bed mobility: Needs Assistance Bed Mobility: Supine to Sit     Supine to sit: Mod assist     General bed mobility comments: Assist to raise trunk  Transfers Overall transfer level: Needs assistance Equipment used: Rolling walker (2 wheeled) Transfers: Sit to/from [employer] Transfers Sit to Stand: From elevated surface;Mod assist Stand pivot transfers: Min assist       General transfer comment: mod A to rise from elevated bed, VCs hand placement, min A to pivot to recliner with RW, uncontrolled descent to  recliner  Ambulation/Gait             General Gait Details: deferred 2* elevated INR & high fall risk  Stairs            Wheelchair Mobility    Modified Rankin (Stroke Patients Only)       Balance Overall balance assessment: Needs assistance;History of Falls   Sitting balance-Leahy Scale: Fair       Standing balance-Leahy Scale: Poor                               Pertinent Vitals/Pain Pain Assessment: No/denies pain    Home Living Family/patient expects to be discharged to:: Private residence Living Arrangements: Spouse/significant other Available Help at Discharge: Family   Home Access: Ramped entrance     Home Layout: Two level;Bed/bath upstairs Home Equipment: Environmental consultant - 2 wheels      Prior Function Level of Independence: Needs assistance   Gait / Transfers Assistance Needed: assitance to walk  ADL's / Homemaking Assistance Needed: assistance for ADLs        Hand Dominance   Dominant Hand: Right    Extremity/Trunk Assessment   Upper Extremity Assessment Upper Extremity Assessment: Defer to OT evaluation    Lower Extremity Assessment Lower Extremity Assessment: Generalized weakness (knee ext 4/5)    Cervical / Trunk Assessment Cervical / Trunk Assessment: Normal  Communication   Communication: No difficulties  Cognition Arousal/Alertness: Awake/alert Behavior During Therapy: WFL for tasks assessed/performed Overall Cognitive Status: Impaired/Different from baseline Area of Impairment: Memory  General Comments      Exercises     Assessment/Plan    PT Assessment Patient needs continued PT services  PT Problem List Decreased activity tolerance;Decreased balance;Decreased mobility;Decreased strength       PT Treatment Interventions Gait training;Functional mobility training;Balance training;Therapeutic exercise;Patient/family education;Therapeutic activities     PT Goals (Current goals can be found in the Care Plan section)  Acute Rehab PT Goals Patient Stated Goal: to reduce burden of care PT Goal Formulation: With family Time For Goal Achievement: 02/04/17 Potential to Achieve Goals: Good    Frequency Min 3X/week   Barriers to discharge Decreased caregiver support wife struggled to care for pt prior to admission    Co-evaluation               End of Session Equipment Utilized During Treatment: Gait belt Activity Tolerance: Patient tolerated treatment well;No increased pain Patient left: in chair;with call bell/phone within reach;with family/visitor present Nurse Communication: Mobility status PT Visit Diagnosis: History of falling (Z91.81);Difficulty in walking, not elsewhere classified (R26.2);Muscle weakness (generalized) (M62.81)    Time: 9024-0973 PT Time Calculation (min) (ACUTE ONLY): 23 min   Charges:   PT Evaluation $PT Eval Moderate Complexity: 1 Procedure PT Treatments $Therapeutic Activity: 8-22 mins   PT G Codes:          Tamala Ser 01/21/2017, 2:09 PM [phone]

## 2017-01-21 NOTE — Progress Notes (Signed)
PROGRESS NOTE    Bruce Price  [name]:[id number] DOB: January 04, 1941 DOA: 01/18/2017 PCP: Bruce Northern, MD    Brief Narrative:  76 y.o. male with medical history significant of sCHF (per EDP, Dr. Drue Second, EF is 25%, no 2d ehco available in Epic), s/p of AICD, hypertension, hyperlipidemia, diabetes mellitus, gout, chronic venous insufficiency in legs, who presents with shortness of breath, leg edema, orthopnea, decreased urine output.  Pt is transferred from [facility] per EDP, Dr. Drue Second.  Pt states that he has been having shortness of breath, leg edema, orthopnea, decreased urine output for several days, which has been progressively getting worse today. Patient has dry cough, no chest pain, fever or chills. Has nausea, no vomiting, diarrhea, abdominal pain or symptoms of UTI. No unilateral weakness, slurred speech or facial droop. No hematuria, hematemesis or hematochezia.  Patient was initially evaluated in [facility]. He was found to have BNP 51400, troponin 0.10, digoix 2.1 (up normal limit is 2.0), WBC 9.8, hemoglobin 14.8, INR 8.0, lactic acid 7.1, negative urinalysis, potassium 5.4, creatinine is up from 1.7 to 3.0 per EDP, bicarbonate 18, AG 30, abnormal liver functions with ALP and 22, AST 191, ALT 113, total bilirubin 2.3, temperature normal, tachycardia with heart rate 112, respiration rate 18, oxygen saturation 95% on room air, blood pressure 176/69. Pt received 600 cc of NS in ED, his lactic acid level decreased from 7.1-->5.0.  Assessment & Plan:   Principal Problem:   Cardiogenic shock (HCC) Active Problems:   Essential hypertension   High cholesterol   Diabetes mellitus (HCC)   Atrial fibrillation (HCC)   Systolic CHF (HCC)   Acute on chronic systolic CHF (congestive heart failure) (HCC)   Elevated lactic acid level   Acute on chronic kidney failure (HCC)   Coagulopathy (HCC)   Abnormal LFTs   Acute respiratory distress   Chronic venous insufficiency  Presence of biventricular cardiac pacemaker   CHF exacerbation (HCC)   Cardiorenal syndrome with renal failure  Acute respiratory distress due to acute on chronic systolic CHF: Patient has elevated BNP 51400, bilateral leg edema and positive JVD, consistent with CHF exacerbation. Pt reported to reported to have EF is 25%. - Cardiology consulted. Appreciate in put. Recommendation to increase dose of lasix -Initially on Lasix 40 mg bid by IV, dose increased by Cardiology to 80mg  bid - Metoprolol 25mg  bid continued by Cardiology - Will recheck BMET in AM - Clinically appears improved  Atrial Fibrillation and Coagulopathy due to coumadin: CHA2DS2-VASc Score is 5 , needs oral anticoagulation. Patient is on Coumadin at home. INR is 8.0 in ED. Heart rate is ~110s. No bleeding tendency.  -Cont to hold coumadin, pharmacy to dose - INR currently 6 -continue beta blocker as above -holding digoxin for now  HTN:  -Will continue metoprolol and doxazosin per above -Plan to continue with metoprolol per above  HLD: Last LDL was not on record -Held zocor due to abnormal liver function - Will repeat cmp in AM  DM-II: Last A1c not on record. Pt used to be on levemir, but currently patient is not taking meds at home (no DM meds in his med list from [place]). CBG 190. -Plan to continue SSI coverage  Elevated lactic acid level:No signs of infection.  - Suspect lactic acidosis secondary to tissue hypoxia  - Clinically stable at present  Acute on chronic CKD:  - Outpt labs reviewed. Baseline Cr of around 1-1.5 - BUN/Cr elevated - Given concerns of volume overload, plan to continue  aggressive diuresis. - Cr is improving with IV lasix - Repeat renal panel in A M  Abnormal LFTs: abnormal liver functions with ALP and 22, AST 191, ALT 113, total bilirubin 2.3. US-RUQ showed gallstone 2.5 cm, gallbladder wall thickening 26.5, this could be from hypoproteinemia per radiologist. Cholecystitis cannot  be excluded completely. Mild ascites. Pt dose not have fever or leukocytosis or AP, dose not seem to have cholecystitis -Possible hepatic congestion secondary to acute heart failure -Holding Zocor - Repeat cmp in AM  Chronic venous insufficiency and skin lesions in legs. -consulted to wound care with recs noted  Hyperkalemia: No EKG change. -Did not tolerate kayexelate enema - Will continue with aggressive diuresis for now  LUE DVT with arm swelling  - LUE DVT noted on UE doppler - Cont with coumadin per pharmacy dosing - Suspect related to pacer wires - Follow up VQ w/ very low likelihood of PE  DVT prophylaxis: coumadin Code Status: DNR Family Communication: Pt in room, updated pt's son and brother Disposition Plan: SNF, timing uncertain  Consultants:   Cardiology  Procedures:     Antimicrobials: Anti-infectives    None      Subjective: No complaints. Reports feeling better   Objective: Vitals:   01/20/17 2249 01/21/17 0637 01/21/17 0916 01/21/17 1527  BP: 115/61 110/71  (!) 96/57  Pulse: 72 62 65 63  Resp:  20  20  Temp:  97.5 F (36.4 C)  97.4 F (36.3 C)  TempSrc:  Oral  Oral  SpO2:  95%  95%  Weight:      Height:        Intake/Output Summary (Last 24 hours) at 01/21/17 1629 Last data filed at 01/21/17 1145  Gross per 24 hour  Intake                0 ml  Output             3550 ml  Net            -3550 ml   Filed Weights   01/19/17 0544 01/20/17 0500 01/20/17 2120  Weight: 101.7 kg (224 lb 3.3 oz) 101.1 kg (222 lb 14.2 oz) 98.5 kg (217 lb 2.5 oz)    Examination:  General exam: laying in bed, awake, conversant Respiratory system: normal chest rise, no audible wheezing Cardiovascular system: regular rhythm, s1, s2 Gastrointestinal system: pos BS, soft, nondistended Central nervous system: no seizures, no tremors Extremities: no cyanosis, no joint deformities Skin: no rashes, no pallor Psychiatry: affect normal// no auditory  hallucinations  Data Reviewed: I have personally reviewed following labs and imaging studies  CBC:  Recent Labs Lab 01/20/17 0425  WBC 13.2*  HGB 13.5  HCT 40.7  MCV 72.4*  PLT 237   Basic Metabolic Panel:  Recent Labs Lab 01/19/17 0858 01/20/17 0425 01/21/17 0551  NA 135 135 140  K 5.5* 5.3* 3.8  CL 93* 96* 97*  CO2 22 27 30   GLUCOSE 147* 107* 132*  BUN 67* 70* 70*  CREATININE 3.21* 3.04* 2.32*  CALCIUM 8.8* 8.2* 8.3*  MG  --  2.3  --    GFR: Estimated Creatinine Clearance: 32.4 mL/min (A) (by C-G formula based on SCr of 2.32 mg/dL (H)). Liver Function Tests: No results for input(s): AST, ALT, ALKPHOS, BILITOT, PROT, ALBUMIN in the last 168 hours. No results for input(s): LIPASE, AMYLASE in the last 168 hours. No results for input(s): AMMONIA in the last 168 hours. Coagulation Profile:  Recent Labs  Lab 01/19/17 0655 01/20/17 0525 01/21/17 0551  INR 7.22* 5.93* 6.09*   Cardiac Enzymes:  Recent Labs Lab 01/19/17 0022 01/19/17 0655 01/19/17 1130  TROPONINI 0.06* 0.06* 0.06*   BNP (last 3 results) No results for input(s): PROBNP in the last 8760 hours. HbA1C:  Recent Labs  01/19/17 0858  HGBA1C 10.0*   CBG:  Recent Labs Lab 01/20/17 1231 01/20/17 1627 01/20/17 2252 01/21/17 0735 01/21/17 1128  GLUCAP 112* 204* 203* 123* 176*   Lipid Profile:  Recent Labs  01/19/17 0858  CHOL 103  HDL 19*  LDLCALC 54  TRIG 409*  CHOLHDL 5.4   Thyroid Function Tests:  Recent Labs  01/20/17 0525  TSH 1.380   Anemia Panel: No results for input(s): VITAMINB12, FOLATE, FERRITIN, TIBC, IRON, RETICCTPCT in the last 72 hours. Sepsis Labs:  Recent Labs Lab 01/19/17 0022  LATICACIDVEN 4.5*    Recent Results (from the past 240 hour(s))  MRSA PCR Screening     Status: None   Collection Time: 01/18/17  9:47 PM  Result Value Ref Range Status   MRSA by PCR NEGATIVE NEGATIVE Final    Comment:        The GeneXpert MRSA Assay (FDA approved for  NASAL specimens only), is one component of a comprehensive MRSA colonization surveillance program. It is not intended to diagnose MRSA infection nor to guide or monitor treatment for MRSA infections.      Radiology Studies: Dg Chest 2 View  Result Date: 01/21/2017 CLINICAL DATA:  Shortness of breath, left-sided weakness, hypertension, diabetes EXAM: CHEST  2 VIEW COMPARISON:  01/19/2017 FINDINGS: Marked cardiomegaly with vascular and interstitial prominence compatible with mild edema. Small pleural effusions present bilaterally with basilar atelectasis. Upper lobes remain clear. No pneumothorax. Trachea is midline. Aorta is atherosclerotic. Left subclavian pacemaker evident. Degenerative changes of the spine and shoulders. IMPRESSION: Cardiomegaly with mild interstitial edema pattern, bibasilar atelectasis, and small effusions compatible with CHF Thoracic aortic atherosclerosis Electronically Signed   By: Judie Petit.  Shick M.D.   On: 01/21/2017 13:11   Nm Pulmonary Perf And Vent  Result Date: 01/21/2017 CLINICAL DATA:  Shortness of breath, known DVT question pulmonary embolism, renal insufficiency EXAM: NUCLEAR MEDICINE VENTILATION - PERFUSION LUNG SCAN TECHNIQUE: Ventilation images were obtained in multiple projections using inhaled aerosol Tc-75m DTPA. Perfusion images were obtained in multiple projections after intravenous injection of Tc-74m MAA. RADIOPHARMACEUTICALS:  32.8 mCi Technetium-75m DTPA aerosol inhalation and 4.0 mCi Technetium-79m MAA IV COMPARISON:  None Correlation: Chest radiograph 01/21/2017 FINDINGS: Ventilation: Scattered central airway deposition of aerosol. Diminished ventilation RIGHT upper lobe and both lower lobes. Subsegmental ventilation defect RIGHT lower lobe. Enlargement of cardiac silhouette. Perfusion: Enlargement of cardiac silhouette. Single subsegmental perfusion defect RIGHT lower lobe matching ventilatory finding. No additional perfusion defects seen. Chest  radiograph: Enlargement of cardiac silhouette post pacemaker. RIGHT basilar atelectasis. IMPRESSION: Very low probability for pulmonary embolism. Electronically Signed   By: Ulyses Southward M.D.   On: 01/21/2017 13:01    Scheduled Meds: . doxazosin  4 mg Oral Daily  . folic acid  1 mg Oral Daily  . furosemide  80 mg Intravenous BID  . insulin aspart  0-5 Units Subcutaneous QHS  . insulin aspart  0-9 Units Subcutaneous TID WC  . metoprolol tartrate  25 mg Oral BID  . sodium chloride flush  3 mL Intravenous Q12H   Continuous Infusions: . sodium chloride       LOS: 3 days   Alwaleed Obeso, Scheryl Marten, MD [facility] Pager  [phone]  If 7PM-7AM, please contact night-coverage [email or web address] Password [facility] 01/21/2017, 4:29 PM

## 2017-01-21 NOTE — Progress Notes (Signed)
ANTICOAGULATION CONSULT NOTE  Pharmacy Consult for warfarin Indication: atrial fibrillation  No Known Allergies  Patient Measurements: Height: 6' (182.9 cm) Weight: 217 lb 2.5 oz (98.5 kg) IBW/kg (Calculated) : 77.6  Vital Signs: Temp: 97.5 F (36.4 C) (04/27 0637) Temp Source: Oral (04/27 0637) BP: 110/71 (04/27 6773) Pulse Rate: 65 (04/27 0916)  Labs:  Recent Labs  01/19/17 0022 01/19/17 7366 01/19/17 8159 01/19/17 1130 01/20/17 0425 01/20/17 0525 01/21/17 0551  HGB  --   --   --   --  13.5  --   --   HCT  --   --   --   --  40.7  --   --   PLT  --   --   --   --  237  --   --   LABPROT  --  64.2*  --   --   --  54.9* 56.0*  INR  --  7.22*  --   --   --  5.93* 6.09*  CREATININE  --   --  3.21*  --  3.04*  --  2.32*  TROPONINI 0.06* 0.06*  --  0.06*  --   --   --     Estimated Creatinine Clearance: 32.4 mL/min (A) (by C-G formula based on SCr of 2.32 mg/dL (H)).   Medical History: Past Medical History:  Diagnosis Date  . Atrial fibrillation (HCC)   . Diabetes mellitus   . High blood pressure   . High cholesterol   . Systolic CHF (HCC)     Medications:  Prescriptions Prior to Admission  Medication Sig Dispense Refill Last Dose  . benazepril (LOTENSIN) 5 MG tablet Take 5 mg by mouth daily.  0 01/17/2017 at Unknown time  . digoxin (LANOXIN) 0.125 MG tablet Take 125 mcg by mouth daily.  0 01/17/2017 at Unknown time  . fish oil-omega-3 fatty acids 1000 MG capsule Take 1 g by mouth daily.    01/17/2017 at Unknown time  . folic acid (FOLVITE) 1 MG tablet Take 1 mg by mouth daily.   01/17/2017 at Unknown time  . furosemide (LASIX) 40 MG tablet Take 40-80 mg by mouth 2 (two) times daily. 80 MG in the morning and 40 MG in the afternoon   01/17/2017 at Unknown time  . metoprolol tartrate (LOPRESSOR) 25 MG tablet Take 50 mg by mouth 2 (two) times daily.  0 01/17/2017 at Unknown time  . simvastatin (ZOCOR) 80 MG tablet Take 80 mg by mouth daily.   01/17/2017 at Unknown time   . warfarin (COUMADIN) 5 MG tablet Take 1 tablet by mouth daily.   01/17/2017 at Unknown time   Scheduled:  . doxazosin  4 mg Oral Daily  . folic acid  1 mg Oral Daily  . furosemide  80 mg Intravenous BID  . insulin aspart  0-5 Units Subcutaneous QHS  . insulin aspart  0-9 Units Subcutaneous TID WC  . metoprolol tartrate  25 mg Oral BID  . sodium chloride flush  3 mL Intravenous Q12H   Infusions:  . sodium chloride      Assessment: 60 yoM with PMH AFib, CHF w/ AICD, HTN, HLD, DM, gout, chronic venous insufficiency of legs, who presents with SOB, LE edema, and oliguria. Initially presented to [facility] where INR was found to supratherapeutic. Patient later transferred to [facility] SDU for AECHF. Pharmacy consulted to dose warfarin while admitted   Baseline INR elevated at 7.2  Prior anticoagulation: warfarin 5 mg daily  Significant events: 4/26 Left upper extremity doppler: new DVT  Today, 01/21/2017:   INR remains supratherapeutic at 6.09 despite dose held yesterday.  Elevation likely d/t hepatic congestion from AECHF.  Cards suspects new DVT is probably secondary to pacemaker wire and elevation in INR is likely an acute event d/t nutritional status & pt may not have been at goal range before.  Last CBC on 4/26 stable  No bleeding documeted  Major drug interactions: none  On regular diet  Goal of Therapy: INR 2-3  Plan:  Continue to hold warfarin tonight  Daily INR  Monitor for signs of bleeding or thrombosis  Dorna Leitz, PharmD, BCPS 01/21/2017 10:27 AM

## 2017-01-21 NOTE — Evaluation (Signed)
Occupational Therapy Evaluation Patient Details Name: Bruce Price MRN: [id number] DOB: [birth year] Today's Date: 01/21/2017    History of Present Illness 76 y.o. male with medical history significant of sCHF (per EDP, Dr. Drue Second, EF is 25%, no 2d ehco available in Epic), s/p of AICD, hypertension, hyperlipidemia, diabetes mellitus, gout, chronic venous insufficiency in legs, who presents with shortness of breath, leg edema, orthopnea, decreased urine output. Dx of respiratory distress, acute on chronic CHF, elevated INR, occlusive LUE DVT (likely due to pacemaker implantation per MD).    Clinical Impression   Pt was admitted for the above.  Unsure of PLOF.  Pt states he was independent and PT documented he needed assistance.  Pt will benefit from continued OT to increase mobility related to adls to decrease burden of care on family.  Pt would benefit from SNF for rehab prior to home    Follow Up Recommendations  SNF    Equipment Recommendations  3 in 1 bedside commode    Recommendations for Other Services       Precautions / Restrictions Precautions Precautions: Fall Precaution Comments: 1 fall 3 months ago, tends to slide out of bed to floor per family Restrictions Weight Bearing Restrictions: No      Mobility Bed Mobility Overal bed mobility: Needs Assistance Bed Mobility: Rolling;Sit to Supine Rolling: Min assist;Mod assist   Supine to sit: Mod assist Sit to supine: Max assist   General bed mobility comments: assist for trunk and legs  Transfers Overall transfer level: Needs assistance Equipment used: Rolling walker (2 wheeled) Transfers: Sit to/from [employer] Sit to Stand: Mod assist;+2 physical assistance Stand pivot transfers: Min assist, once up       General transfer comment: mod +2 from recliner to stand; min A pivotal steps. Assist to control descent    Balance Overall balance assessment: Needs assistance;History of Falls   Sitting  balance-Leahy Scale: Fair       Standing balance-Leahy Scale: Poor                             ADL either performed or assessed with clinical judgement   ADL Overall ADL's : Needs assistance/impaired     Grooming: Set up;Sitting   Upper Body Bathing: Minimal assistance;Sitting   Lower Body Bathing: Maximal assistance;Sit to/from stand;+2 for physical assistance   Upper Body Dressing : Moderate assistance;Sitting   Lower Body Dressing: Total assistance;Sit to/from stand;+2 for physical assistance   Toilet Transfer: Moderate assistance;+2 for physical assistance;RW;Stand-pivot Toilet Transfer Details (indicate cue type and reason): recliner to bed Toileting- Clothing Manipulation and Hygiene: Maximal assistance;+2 for physical assistance;Sit to/from stand         General ADL Comments: pt had difficulty following commands to turn at and angle prior to lying down as well as bridging/shifting over in bed.  he asked family to come help him stand, even though I called for additional assistance.  NT helped me reposition him in bed.       Vision         Perception     Praxis      Pertinent Vitals/Pain Pain Assessment: No/denies pain     Hand Dominance Right   Extremity/Trunk Assessment Upper Extremity Assessment Upper Extremity Assessment: LUE deficits/detail;Generalized weakness LUE Deficits / Details: DVT present.  bil grips 3+/5.  Shoulder strength 2-/5:  able to lift approximately 30      Cervical / Trunk Assessment Cervical /  Trunk Assessment: Normal   Communication Communication Communication:  (very difficult to understand)   Cognition Arousal/Alertness: Awake/alert Behavior During Therapy: WFL for tasks assessed/performed Overall Cognitive Status: Impaired/Different from baseline Area of Impairment: Memory;Following commands                               General Comments: had difficulty with directional cues; multimodal cues  needed   General Comments       Exercises     Shoulder Instructions      Home Living Family/patient expects to be discharged to:: Private residence Living Arrangements: Spouse/significant other Available Help at Discharge: Family   Home Access: Ramped entrance     Home Layout: Two level;Bed/bath upstairs Alternate Level Stairs-Number of Steps: 10 Alternate Level Stairs-Rails: Can reach both;Left;Right Bathroom Shower/Tub: Walk-in shower (stand up)   Bathroom Toilet: Standard     Home Equipment: Environmental consultant - 2 wheels          Prior Functioning/Environment Level of Independence: Needs assistance  Gait / Transfers Assistance Needed: assitance to walk ADL's / Homemaking Assistance Needed: assistance for ADLs   Comments: pt states he was independent with adls; PT told assist for this and ambulating        OT Problem List: Impaired balance (sitting and/or standing);Decreased strength;Decreased activity tolerance;Decreased cognition;Decreased safety awareness;Decreased knowledge of use of DME or AE      OT Treatment/Interventions: Self-care/ADL training;DME and/or AE instruction;Patient/family education;Therapeutic activities;Therapeutic exercise;Balance training;Cognitive remediation/compensation    OT Goals(Current goals can be found in the care plan section) Acute Rehab OT Goals Patient Stated Goal: to reduce burden of care OT Goal Formulation: With patient Time For Goal Achievement: 02/04/17 Potential to Achieve Goals: Good ADL Goals Pt Will Perform Grooming: with min assist;standing Pt Will Transfer to Toilet: with min assist;stand pivot transfer;bedside commode Additional ADL Goal #1: pt will go from sit to stand with min A and maintain for 2 minutes for ADLs with min A Additional ADL Goal #2: pt will perform bed mobility at min A level in preparation for adls  OT Frequency: Min 2X/week   Barriers to D/C:            Co-evaluation              End of  Session Equipment Utilized During Treatment: Gait belt  Activity Tolerance: Patient limited by fatigue Patient left: in bed;with call bell/phone within reach;with bed alarm set  OT Visit Diagnosis: Unsteadiness on feet (R26.81);Muscle weakness (generalized) (M62.81)                Time: 1610-9604 OT Time Calculation (min): 21 min Charges:  OT General Charges $OT Visit: 1 Procedure OT Evaluation $OT Eval Moderate Complexity: 1 Procedure G-Codes:     [place], OTR/L [phone] 01/21/2017  Kiylee Thoreson 01/21/2017, 3:50 PM

## 2017-01-21 NOTE — Progress Notes (Signed)
OT Cancellation Note  Patient Details Name: Bruce Price MRN: [id number] DOB: March 05, 1941   Cancelled Treatment:    Reason Eval/Treat Not Completed: Medical issues which prohibited therapy.  Awaiting NM study to r/u PE. Will check back.  Woodie Degraffenreid 01/21/2017, 10:37 AM  Marica Otter, OTR/L [phone] 01/21/2017

## 2017-01-21 NOTE — Progress Notes (Signed)
Progress Note  Patient Name: Bruce Price Date of Encounter: 01/21/2017  Primary Cardiologist: Dr Curlene Labrum  Subjective   Pt awake and alert this am. Comfortable, almost flat in bed though he was unable to sit up on his own or even roll over without assistance.   Inpatient Medications    Scheduled Meds: . doxazosin  4 mg Oral Daily  . folic acid  1 mg Oral Daily  . furosemide  80 mg Intravenous BID  . insulin aspart  0-5 Units Subcutaneous QHS  . insulin aspart  0-9 Units Subcutaneous TID WC  . metoprolol tartrate  25 mg Oral BID  . sodium chloride flush  3 mL Intravenous Q12H   Continuous Infusions: . sodium chloride     PRN Meds: sodium chloride, ondansetron (ZOFRAN) IV, sodium chloride flush   Vital Signs    Vitals:   01/20/17 2000 01/20/17 2120 01/20/17 2249 01/21/17 0637  BP: (!) 110/56 98/82 115/61 110/71  Pulse: (!) 110 60 72 62  Resp: (!) 22 20  20   Temp:  97.5 F (36.4 C)  97.5 F (36.4 C)  TempSrc:  Oral  Oral  SpO2: 97% 94%  95%  Weight:  217 lb 2.5 oz (98.5 kg)    Height:  6' (1.829 m)      Intake/Output Summary (Last 24 hours) at 01/21/17 0744 Last data filed at 01/21/17 0700  Gross per 24 hour  Intake              480 ml  Output             4425 ml  Net            -3945 ml   Filed Weights   01/19/17 0544 01/20/17 0500 01/20/17 2120  Weight: 224 lb 3.3 oz (101.7 kg) 222 lb 14.2 oz (101.1 kg) 217 lb 2.5 oz (98.5 kg)    Telemetry     paced, one couplet- Personally Reviewed  ECG    NSR-tracking A, pacing V - Personally Reviewed  Physical Exam   GEN: No acute distress.  Awake and alert, poor dentition Neck: No JVD Cardiac: RRR, no murmurs, rubs, or gallops.  Respiratory: clear  GI: Soft, nontender, non-distended  MS: 1+ edema LUE Neuro:  Nonfocal  Psych: Normal affect   Labs    Chemistry  Recent Labs Lab 01/19/17 0858 01/20/17 0425 01/21/17 0551  NA 135 135 140  K 5.5* 5.3* 3.8  CL 93* 96* 97*  CO2 22 27 30   GLUCOSE  147* 107* 132*  BUN 67* 70* 70*  CREATININE 3.21* 3.04* 2.32*  CALCIUM 8.8* 8.2* 8.3*  GFRNONAA 17* 18* 25*  GFRAA 20* 21* 30*  ANIONGAP 20* 12 13     Hematology  Recent Labs Lab 01/20/17 0425  WBC 13.2*  RBC 5.62  HGB 13.5  HCT 40.7  MCV 72.4*  MCH 24.0*  MCHC 33.2  RDW 16.6*  PLT 237    Cardiac Enzymes  Recent Labs Lab 01/19/17 0022 01/19/17 0655 01/19/17 1130  TROPONINI 0.06* 0.06* 0.06*   No results for input(s): TROPIPOC in the last 168 hours.   BNP  Recent Labs Lab 01/19/17 1130  BNP 3,881.3*     DDimer No results for input(s): DDIMER in the last 168 hours.   Radiology    Dg Chest Port 1 View  Result Date: 01/19/2017 CLINICAL DATA:  Congestive heart failure. EXAM: PORTABLE CHEST 1 VIEW COMPARISON:  Radiographs of January 18, 2017. FINDINGS: Stable  cardiomegaly. Atherosclerosis of thoracic aorta is noted. Left-sided pacemaker is unchanged in position. Mild central pulmonary vascular congestion is noted. Minimal right pleural effusion is noted along right lateral chest wall. Degenerative changes seen involving both acromioclavicular joints. Left lung is clear. Mild right basilar subsegmental atelectasis is noted. IMPRESSION: Aortic atherosclerosis. Stable cardiomegaly with central pulmonary vascular congestion. Minimal right pleural effusion is noted. Mild right basilar subsegmental atelectasis. Electronically Signed   By: Lupita Raider, M.D.   On: 01/19/2017 09:37    Cardiac Studies   Echo 01/19/17 pending  Patient Profile     76 y.o. male history of NICM and PAF on Coumadin. He had a cath in 2016- 30% mLAD 25%. He then had a BiV pacemaker implanted in Aug 2017 after he presented to [facility] with bradycardia. Other medical history includes chronic venous edema and venous ulcer.  He was admitted to [facility] 01/18/17 as a transfer from [facility] in acute respiratory distress after he presented there with SOB. He was found to have a BNP of 5150, elevated LFTs,  an INR of 8,  K+ 5.4, and a SCr of 3.0 (up from 1.7).  He is a DNR and aggressive measures (IV inotropics) are not felt to be appropriate.  Assessment & Plan     1. Acute on chronic CHF- Good diuresis on increased lasix- I/O negative 4.9L since admission and his wgt is down 5 lbs (still not sure what his dry wgt is). He has improved but I suspect significantly debilitated at baseline. Pt is DNR, no aggressive measures  2. NICM- pt's EF was 25% at cath Oct 2016  3. PAF with RVR- rate controlled-now paced  4. CAD- 30% mLAD at cath Oct 2016 (Dr Dot Been in [facility]) after an abnormal stress   5. Acute on chronic renal insufficiency-  Baseline SCr reportedly 1.7. His SCr on admission was 3.21, it has improved with diuresis- 2.3 today  6. S/P Bi Pacemaker- [employer] Bi V pacemaker by Dr Concha Norway in Aug 2017- (no ICD)  7. Chronic anticoagulation- CHA2Ds2 VASc=5, per Dr Concha Norway. Pt is on Coumadin with supra therapeutic INR on admission- 7.22. He is still high, 6.0 today  8. Chronic venous edema- chronic   9. LUE DVT- ? Why he has an "acute" Lt SCA DVT with an INR of 7. 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   Plan: MD to see this am. Probably continue IV Lasix one more day then change to po. We will see again Monday. Pt will follow up with Dr Tomie China after discharge.   Jolene Provost, PA-C  01/21/2017, 7:44 AM

## 2017-01-21 NOTE — Progress Notes (Signed)
CRITICAL VALUE ALERT  Critical value received:  INR- 6.09  Date of notification:  01/21/17  Time of notification:  0740  Critical value read back:Yes.    Nurse who received alert:  Sumner Boast   MD notified (1st page):  Dr. Rhona Leavens  Time of first page:  0745  MD notified (2nd page):  Time of second page:  Responding MD: Dr Eligah East  Time MD responded:  [phone]

## 2017-01-21 NOTE — Progress Notes (Signed)
PT Cancellation Note  Patient Details Name: Bruce Price MRN: [id number] DOB: Jul 25, 1941   Cancelled Treatment:    Reason Eval/Treat Not Completed: Patient at procedure or test/unavailable (pt @ imaging to check for PE. Will follow. )   Tamala Ser 01/21/2017, 11:52 AM [phone]

## 2017-01-22 DIAGNOSIS — I481 Persistent atrial fibrillation: Secondary | ICD-10-CM

## 2017-01-22 DIAGNOSIS — I5043 Acute on chronic combined systolic (congestive) and diastolic (congestive) heart failure: Secondary | ICD-10-CM

## 2017-01-22 LAB — COMPREHENSIVE METABOLIC PANEL
ALT: 189 U/L — AB (ref 17–63)
AST: 173 U/L — ABNORMAL HIGH (ref 15–41)
Albumin: 2.9 g/dL — ABNORMAL LOW (ref 3.5–5.0)
Alkaline Phosphatase: 78 U/L (ref 38–126)
Anion gap: 11 (ref 5–15)
BUN: 57 mg/dL — AB (ref 6–20)
CHLORIDE: 93 mmol/L — AB (ref 101–111)
CO2: 32 mmol/L (ref 22–32)
CREATININE: 1.73 mg/dL — AB (ref 0.61–1.24)
Calcium: 8.3 mg/dL — ABNORMAL LOW (ref 8.9–10.3)
GFR calc Af Amer: 42 mL/min — ABNORMAL LOW (ref >60)
GFR, EST NON AFRICAN AMERICAN: 36 mL/min — AB (ref >60)
GLUCOSE: 167 mg/dL — AB (ref 65–99)
Potassium: 3.7 mmol/L (ref 3.5–5.1)
Sodium: 136 mmol/L (ref 135–145)
Total Bilirubin: 2 mg/dL — ABNORMAL HIGH (ref 0.3–1.2)
Total Protein: 6.3 g/dL — ABNORMAL LOW (ref 6.5–8.1)

## 2017-01-22 LAB — GLUCOSE, CAPILLARY
GLUCOSE-CAPILLARY: 265 mg/dL — AB (ref 65–99)
GLUCOSE-CAPILLARY: 102 mg/dL — AB (ref 65–99)
Glucose-Capillary: 146 mg/dL — ABNORMAL HIGH (ref 65–99)
Glucose-Capillary: 229 mg/dL — ABNORMAL HIGH (ref 65–99)

## 2017-01-22 LAB — PROTIME-INR
INR: 3.23
Prothrombin Time: 33.7 seconds — ABNORMAL HIGH (ref 11.4–15.2)

## 2017-01-22 MED ORDER — METOPROLOL TARTRATE 50 MG PO TABS
50.0000 mg | ORAL_TABLET | Freq: Two times a day (BID) | ORAL | Status: DC
  Administered 2017-01-22 – 2017-01-25 (×7): 50 mg via ORAL
  Filled 2017-01-22 (×7): qty 1

## 2017-01-22 MED ORDER — FUROSEMIDE 10 MG/ML IJ SOLN
60.0000 mg | Freq: Two times a day (BID) | INTRAMUSCULAR | Status: DC
  Administered 2017-01-22 – 2017-01-23 (×2): 60 mg via INTRAVENOUS
  Filled 2017-01-22 (×2): qty 6

## 2017-01-22 NOTE — Progress Notes (Signed)
PROGRESS NOTE    Bruce Price  OMV:[id number] DOB: 1940/10/19 DOA: 01/18/2017 PCP: Eloisa Northern, MD    Brief Narrative:  76 y.o. male with medical history significant of sCHF (per EDP, Dr. Drue Second, EF is 25%, no 2d ehco available in Epic), s/p of AICD, hypertension, hyperlipidemia, diabetes mellitus, gout, chronic venous insufficiency in legs, who presents with shortness of breath, leg edema, orthopnea, decreased urine output.  Pt is transferred from [facility] per EDP, Dr. Drue Second.  Pt states that he has been having shortness of breath, leg edema, orthopnea, decreased urine output for several days, which has been progressively getting worse today. Patient has dry cough, no chest pain, fever or chills. Has nausea, no vomiting, diarrhea, abdominal pain or symptoms of UTI. No unilateral weakness, slurred speech or facial droop. No hematuria, hematemesis or hematochezia.  Patient was initially evaluated in [facility]. He was found to have BNP 51400, troponin 0.10, digoix 2.1 (up normal limit is 2.0), WBC 9.8, hemoglobin 14.8, INR 8.0, lactic acid 7.1, negative urinalysis, potassium 5.4, creatinine is up from 1.7 to 3.0 per EDP, bicarbonate 18, AG 30, abnormal liver functions with ALP and 22, AST 191, ALT 113, total bilirubin 2.3, temperature normal, tachycardia with heart rate 112, respiration rate 18, oxygen saturation 95% on room air, blood pressure 176/69. Pt received 600 cc of NS in ED, his lactic acid level decreased from 7.1-->5.0.  Assessment & Plan:   Principal Problem:   Cardiogenic shock (HCC) Active Problems:   Essential hypertension   High cholesterol   Diabetes mellitus (HCC)   Atrial fibrillation (HCC)   Systolic CHF (HCC)   Acute on chronic systolic CHF (congestive heart failure) (HCC)   Elevated lactic acid level   Acute on chronic kidney failure (HCC)   Coagulopathy (HCC)   Abnormal LFTs   Acute respiratory distress   Chronic venous insufficiency  Presence of biventricular cardiac pacemaker   CHF exacerbation (HCC)   Cardiorenal syndrome with renal failure  Acute respiratory distress due to acute on chronic systolic CHF:  - Patient presented with elevated BNP 51400, bilateral leg edema and positive JVD, consistent with CHF exacerbation. Pt reported to reported to have EF is 25%. - Cardiology consulted. Appreciate in put. Recommendation to increase dose of lasix -Initially on Lasix 40 mg bid by IV, dose increased by Cardiology to 80mg  bid - Metoprolol 25mg  bid continued by Cardiology - Clinically much improved  Atrial Fibrillation and Coagulopathy due to coumadin: CHA2DS2-VASc Score is 5 , needs oral anticoagulation. Patient is on Coumadin at home. INR is 8.0 in ED. Heart rate is ~110s. No bleeding tendency.  -Pharmacy to dose coumadin - INR down to 3 -continue beta blocker as above -holding digoxin for now  HTN:  -Will continue metoprolol and doxazosin per above -Will continue with metoprolol per above  HLD:  - Last LDL was not on record -Held zocor due to abnormal liver function - Recheck cmp in AM  DM-II:  - Last A1c not on record. Pt used to be on levemir, but currently patient is not taking meds at home (no DM meds in his med list from [place]). -Will continue SSI coverage  Elevated lactic acid level: - No signs of infection.  - Suspect lactic acidosis secondary to tissue hypoxia  - Appears stable at present  Acute on chronic CKD:  - Outpt labs reviewed. Baseline Cr of around 1-1.5 - Given concerns of volume overload, will continue aggressive diuresis. - Cr has improved with IV  lasix - As Cr is now in the 1 range, will decrease dose to 60mg  BID - Repeat renal panel in A M  Abnormal LFTs: abnormal liver functions with ALP and 22, AST 191, ALT 113, total bilirubin 2.3. US-RUQ showed gallstone 2.5 cm, gallbladder wall thickening 26.5, this could be from hypoproteinemia per radiologist. Cholecystitis cannot  be excluded completely. Mild ascites. Pt dose not have fever or leukocytosis or AP, dose not seem to have cholecystitis -Possible hepatic congestion secondary to acute heart failure -Continuing to hold Zocor - Recheck cmp in AM  Chronic venous insufficiency and skin lesions in legs. -consulted to wound care with recommendations noted  Hyperkalemia: No EKG change. -Did not tolerate kayexelate enema - Resolved with lasix  LUE DVT with arm swelling  - LUE DVT noted on UE doppler - Will cont with coumadin per pharmacy dosing - Suspect related to pacer wires - VQ w/ very low likelihood of PE  DVT prophylaxis: coumadin Code Status: DNR Family Communication: Pt in room, updated pt's son and brother Disposition Plan: SNF, timing uncertain  Consultants:   Cardiology  Procedures:     Antimicrobials: Anti-infectives    None      Subjective: Reports feeling better  Objective: Vitals:   01/21/17 1527 01/21/17 2119 01/22/17 0532 01/22/17 1340  BP: (!) 96/57 98/74 105/70 101/64  Pulse: 63 (!) 105 (!) 114 (!) 59  Resp: 20 20 18 18   Temp: 97.4 F (36.3 C) 97.8 F (36.6 C) 97.6 F (36.4 C)   TempSrc: Oral Oral Oral   SpO2: 95% 96% 94% 98%  Weight:   43.7 kg (96 lb 4.8 oz)   Height:        Intake/Output Summary (Last 24 hours) at 01/22/17 1719 Last data filed at 01/22/17 1620  Gross per 24 hour  Intake              240 ml  Output             4050 ml  Net            -3810 ml   Filed Weights   01/20/17 0500 01/20/17 2120 01/22/17 0532  Weight: 101.1 kg (222 lb 14.2 oz) 98.5 kg (217 lb 2.5 oz) 43.7 kg (96 lb 4.8 oz)    Examination:  General exam: awake, laying in bed, in nad Respiratory system: normal resp effort, no wheezing Cardiovascular system: regular rate, s1-2 Gastrointestinal system: nontender, nondistended, pos BS Central nervous system: cn2-12 grossly intact, strength intact Extremities: no clubbing, perfused Skin: normal skin turgor, no notable skin  lesions seen Psychiatry: mood normal// no visual hallucinations  Data Reviewed: I have personally reviewed following labs and imaging studies  CBC:  Recent Labs Lab 01/20/17 0425  WBC 13.2*  HGB 13.5  HCT 40.7  MCV 72.4*  PLT 237   Basic Metabolic Panel:  Recent Labs Lab 01/19/17 0858 01/20/17 0425 01/21/17 0551 01/22/17 0543  NA 135 135 140 136  K 5.5* 5.3* 3.8 3.7  CL 93* 96* 97* 93*  CO2 22 27 30  32  GLUCOSE 147* 107* 132* 167*  BUN 67* 70* 70* 57*  CREATININE 3.21* 3.04* 2.32* 1.73*  CALCIUM 8.8* 8.2* 8.3* 8.3*  MG  --  2.3  --   --    GFR: Estimated Creatinine Clearance: 22.1 mL/min (A) (by C-G formula based on SCr of 1.73 mg/dL (H)). Liver Function Tests:  Recent Labs Lab 01/22/17 0543  AST 173*  ALT 189*  ALKPHOS 78  BILITOT 2.0*  PROT 6.3*  ALBUMIN 2.9*   No results for input(s): LIPASE, AMYLASE in the last 168 hours. No results for input(s): AMMONIA in the last 168 hours. Coagulation Profile:  Recent Labs Lab 01/19/17 0655 01/20/17 0525 01/21/17 0551 01/22/17 0543  INR 7.22* 5.93* 6.09* 3.23   Cardiac Enzymes:  Recent Labs Lab 01/19/17 0022 01/19/17 0655 01/19/17 1130  TROPONINI 0.06* 0.06* 0.06*   BNP (last 3 results) No results for input(s): PROBNP in the last 8760 hours. HbA1C: No results for input(s): HGBA1C in the last 72 hours. CBG:  Recent Labs Lab 01/21/17 1744 01/21/17 2124 01/22/17 0721 01/22/17 1114 01/22/17 1556  GLUCAP 243* 193* 146* 229* 265*   Lipid Profile: No results for input(s): CHOL, HDL, LDLCALC, TRIG, CHOLHDL, LDLDIRECT in the last 72 hours. Thyroid Function Tests:  Recent Labs  01/20/17 0525  TSH 1.380   Anemia Panel: No results for input(s): VITAMINB12, FOLATE, FERRITIN, TIBC, IRON, RETICCTPCT in the last 72 hours. Sepsis Labs:  Recent Labs Lab 01/19/17 0022  LATICACIDVEN 4.5*    Recent Results (from the past 240 hour(s))  MRSA PCR Screening     Status: None   Collection Time:  01/18/17  9:47 PM  Result Value Ref Range Status   MRSA by PCR NEGATIVE NEGATIVE Final    Comment:        The GeneXpert MRSA Assay (FDA approved for NASAL specimens only), is one component of a comprehensive MRSA colonization surveillance program. It is not intended to diagnose MRSA infection nor to guide or monitor treatment for MRSA infections.      Radiology Studies: Dg Chest 2 View  Result Date: 01/21/2017 CLINICAL DATA:  Shortness of breath, left-sided weakness, hypertension, diabetes EXAM: CHEST  2 VIEW COMPARISON:  01/19/2017 FINDINGS: Marked cardiomegaly with vascular and interstitial prominence compatible with mild edema. Small pleural effusions present bilaterally with basilar atelectasis. Upper lobes remain clear. No pneumothorax. Trachea is midline. Aorta is atherosclerotic. Left subclavian pacemaker evident. Degenerative changes of the spine and shoulders. IMPRESSION: Cardiomegaly with mild interstitial edema pattern, bibasilar atelectasis, and small effusions compatible with CHF Thoracic aortic atherosclerosis Electronically Signed   By: Judie Petit.  Shick M.D.   On: 01/21/2017 13:11   Nm Pulmonary Perf And Vent  Result Date: 01/21/2017 CLINICAL DATA:  Shortness of breath, known DVT question pulmonary embolism, renal insufficiency EXAM: NUCLEAR MEDICINE VENTILATION - PERFUSION LUNG SCAN TECHNIQUE: Ventilation images were obtained in multiple projections using inhaled aerosol Tc-74m DTPA. Perfusion images were obtained in multiple projections after intravenous injection of Tc-46m MAA. RADIOPHARMACEUTICALS:  32.8 mCi Technetium-31m DTPA aerosol inhalation and 4.0 mCi Technetium-17m MAA IV COMPARISON:  None Correlation: Chest radiograph 01/21/2017 FINDINGS: Ventilation: Scattered central airway deposition of aerosol. Diminished ventilation RIGHT upper lobe and both lower lobes. Subsegmental ventilation defect RIGHT lower lobe. Enlargement of cardiac silhouette. Perfusion: Enlargement of  cardiac silhouette. Single subsegmental perfusion defect RIGHT lower lobe matching ventilatory finding. No additional perfusion defects seen. Chest radiograph: Enlargement of cardiac silhouette post pacemaker. RIGHT basilar atelectasis. IMPRESSION: Very low probability for pulmonary embolism. Electronically Signed   By: Ulyses Southward M.D.   On: 01/21/2017 13:01    Scheduled Meds: . doxazosin  4 mg Oral Daily  . folic acid  1 mg Oral Daily  . furosemide  80 mg Intravenous BID  . insulin aspart  0-5 Units Subcutaneous QHS  . insulin aspart  0-9 Units Subcutaneous TID WC  . metoprolol tartrate  50 mg Oral BID  . sodium chloride flush  3 mL Intravenous Q12H   Continuous Infusions: . sodium chloride       LOS: 4 days   Aixa Corsello, Scheryl Marten, MD [facility] Pager [phone]  If 7PM-7AM, please contact night-coverage [email or web address] Password [facility] 01/22/2017, 5:19 PM

## 2017-01-22 NOTE — Progress Notes (Signed)
Progress Note  Patient Name: Bruce Price Date of Encounter: 01/21/2017  Primary Cardiologist: Dr Curlene Labrum  Subjective   Pt awake and alert this am. Denies CP or SOB  Inpatient Medications    Scheduled Meds: . doxazosin  4 mg Oral Daily  . folic acid  1 mg Oral Daily  . furosemide  80 mg Intravenous BID  . insulin aspart  0-5 Units Subcutaneous QHS  . insulin aspart  0-9 Units Subcutaneous TID WC  . metoprolol tartrate  25 mg Oral BID  . sodium chloride flush  3 mL Intravenous Q12H   Continuous Infusions: . sodium chloride     PRN Meds: sodium chloride, ondansetron (ZOFRAN) IV, sodium chloride flush   Vital Signs          Vitals:   01/20/17 2000 01/20/17 2120 01/20/17 2249 01/21/17 0637  BP: (!) 110/56 98/82 115/61 110/71  Pulse: (!) 110 60 72 62  Resp: (!) 22 20  20   Temp:  97.5 F (36.4 C)  97.5 F (36.4 C)  TempSrc:  Oral  Oral  SpO2: 97% 94%  95%  Weight:  217 lb 2.5 oz (98.5 kg)    Height:  6' (1.829 m)      Intake/Output Summary (Last 24 hours) at 01/21/17 0744 Last data filed at 01/21/17 0700  Gross per 24 hour  Intake              480 ml  Output             4425 ml  Net            -3945 ml        Filed Weights   01/19/17 0544 01/20/17 0500 01/20/17 2120  Weight: 224 lb 3.3 oz (101.7 kg) 222 lb 14.2 oz (101.1 kg) 217 lb 2.5 oz (98.5 kg)    Telemetry     afib, intermittently paced, one couplet- Personally Reviewed   Physical Exam   GEN:No acute distress.  Awake and alert, poor dentition, chronically ill and frail HEENT: OP clear Neck:No JVD Cardiac:iRRR  Respiratory: clear  SX:JDBZ, nontender, non-distended  MS:1+ edema LUE, diffuse muscle atrophy Neuro:Nonfocal  Psych: Normal affect  Skin: pacemaker site looks good  Labs    Chemistry  Last Labs    Recent Labs Lab 01/19/17 0858 01/20/17 0425 01/21/17 0551  NA 135 135 140  K 5.5* 5.3* 3.8  CL 93* 96* 97*  CO2 22 27 30     GLUCOSE 147* 107* 132*  BUN 67* 70* 70*  CREATININE 3.21* 3.04* 2.32*  CALCIUM 8.8* 8.2* 8.3*  GFRNONAA 17* 18* 25*  GFRAA 20* 21* 30*  ANIONGAP 20* 12 13       Hematology  Last Labs    Recent Labs Lab 01/20/17 0425  WBC 13.2*  RBC 5.62  HGB 13.5  HCT 40.7  MCV 72.4*  MCH 24.0*  MCHC 33.2  RDW 16.6*  PLT 237      Cardiac Enzymes  Last Labs    Recent Labs Lab 01/19/17 0022 01/19/17 0655 01/19/17 1130  TROPONINI 0.06* 0.06* 0.06*      Last Labs   No results for input(s): TROPIPOC in the last 168 hours.     BNP  Last Labs    Recent Labs Lab 01/19/17 1130  BNP 3,881.3*       DDimer  Last Labs   No results for input(s): DDIMER in the last 168 hours.    Patient Profile  76 y.o. male history of NICM and PAF on Coumadin. He had a cath in 2016- 30% mLAD 25%. He then had a BiV pacemaker implanted in Aug 2017 after he presented to [facility] with bradycardia. Other medical history includes chronic venous edema and venous ulcer.  He was admitted to [facility] 01/18/17 as a transfer from [facility] in acute respiratory distress after he presented there with SOB. He was found to have a BNP of 5150, elevated LFTs, an INR of 8, K+ 5.4, and a SCr of 3.0 (up from 1.7).  He is a DNR and aggressive measures (IV inotropics) are not felt to be appropriate.  Assessment & Plan     1. 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  He has improved but I suspect significantly debilitated at baseline. Pt is DNR, no aggressive measures.  Echo is reviewed which reveals biventricular failure.  Will increase metoprolol for better rate control.  Consider switching to coreg once more stable.  2. NICM- pt's EF is 25%.  He also has severe RV failure.  Medical management  3. PAF with RVR-rates increased today.  Will increase metoprolol. Given renal failure, I would not advise restarting digoxin.  4. CAD- 30% mLAD at cath Oct 2016 (Dr Dot Been  in [facility]) after an abnormal stress   5. Acute on chronic renal insufficiency-  Baseline SCr reportedly 1.7.  Stable with diuresis  6.  Chronic anticoagulation-CHA2Ds2 VASc=5, per Dr Concha Norway. Pt is on Coumadin with supra therapeutic INR on admission- 7.22.  INR is improving slowly.  7. Chronic venous edema-chronic    Plan:  Continue diuresis as tolerated over the weekend.  Cardiology will see again Monday. Pt will follow up with Dr Tomie China after discharge.   Hillis Range MD, [facility] 01/22/2017 8:16 AM

## 2017-01-22 NOTE — Progress Notes (Signed)
ANTICOAGULATION CONSULT NOTE  Pharmacy Consult for warfarin Indication: atrial fibrillation  No Known Allergies  Patient Measurements: Height: 6' (182.9 cm) Weight: 96 lb 4.8 oz (43.7 kg) IBW/kg (Calculated) : 77.6  Vital Signs: Temp: 97.6 F (36.4 C) (04/28 0532) Temp Source: Oral (04/28 0532) BP: 105/70 (04/28 0532) Pulse Rate: 114 (04/28 0532)  Labs:  Recent Labs  01/19/17 1130 01/20/17 0425 01/20/17 0525 01/21/17 0551 01/22/17 0543  HGB  --  13.5  --   --   --   HCT  --  40.7  --   --   --   PLT  --  237  --   --   --   LABPROT  --   --  54.9* 56.0* 33.7*  INR  --   --  5.93* 6.09* 3.23  CREATININE  --  3.04*  --  2.32* 1.73*  TROPONINI 0.06*  --   --   --   --     Estimated Creatinine Clearance: 22.1 mL/min (A) (by C-G formula based on SCr of 1.73 mg/dL (H)).   Medical History: Past Medical History:  Diagnosis Date  . Atrial fibrillation (HCC)   . Diabetes mellitus   . High blood pressure   . High cholesterol   . Systolic CHF (HCC)     Medications:  Prescriptions Prior to Admission  Medication Sig Dispense Refill Last Dose  . benazepril (LOTENSIN) 5 MG tablet Take 5 mg by mouth daily.  0 01/17/2017 at Unknown time  . digoxin (LANOXIN) 0.125 MG tablet Take 125 mcg by mouth daily.  0 01/17/2017 at Unknown time  . fish oil-omega-3 fatty acids 1000 MG capsule Take 1 g by mouth daily.    01/17/2017 at Unknown time  . folic acid (FOLVITE) 1 MG tablet Take 1 mg by mouth daily.   01/17/2017 at Unknown time  . furosemide (LASIX) 40 MG tablet Take 40-80 mg by mouth 2 (two) times daily. 80 MG in the morning and 40 MG in the afternoon   01/17/2017 at Unknown time  . metoprolol tartrate (LOPRESSOR) 25 MG tablet Take 50 mg by mouth 2 (two) times daily.  0 01/17/2017 at Unknown time  . simvastatin (ZOCOR) 80 MG tablet Take 80 mg by mouth daily.   01/17/2017 at Unknown time  . warfarin (COUMADIN) 5 MG tablet Take 1 tablet by mouth daily.   01/17/2017 at Unknown time    Scheduled:  . doxazosin  4 mg Oral Daily  . folic acid  1 mg Oral Daily  . furosemide  80 mg Intravenous BID  . insulin aspart  0-5 Units Subcutaneous QHS  . insulin aspart  0-9 Units Subcutaneous TID WC  . metoprolol tartrate  50 mg Oral BID  . sodium chloride flush  3 mL Intravenous Q12H   Infusions:  . sodium chloride      Assessment: 3 yoM with PMH AFib, CHF w/ AICD, HTN, HLD, DM, gout, chronic venous insufficiency of legs, who presents with SOB, LE edema, and oliguria. Initially presented to [facility] where INR was found to supratherapeutic. Patient later transferred to [facility] SDU for AECHF. Pharmacy consulted to dose warfarin while admitted   Baseline INR elevated at 7.2  Prior anticoagulation: warfarin 5 mg daily  Significant events: 4/26 Left upper extremity doppler: new DVT  Today, 01/22/2017:   INR remains supratherapeutic at 3.23 but trending down.  Elevation likely d/t hepatic congestion from AECHF.  Cards suspects new DVT is probably secondary to pacemaker wire  and elevation in INR is likely an acute event d/t nutritional status & pt may not have been at goal range before. They recommend considering switch to Eliquis (would wait until INR < 2).  Last CBC on 4/26 stable  No bleeding documeted  Major drug interactions: none  On regular diet  Goal of Therapy: INR 2-3  Plan:  Continue to hold warfarin tonight  Daily INR  Monitor for signs of bleeding or thrombosis  Loralee Pacas, PharmD, BCPS Pager: [phone] 01/22/2017 9:09 AM

## 2017-01-23 LAB — COMPREHENSIVE METABOLIC PANEL
ALK PHOS: 83 U/L (ref 38–126)
ALT: 150 U/L — AB (ref 17–63)
AST: 118 U/L — AB (ref 15–41)
Albumin: 2.9 g/dL — ABNORMAL LOW (ref 3.5–5.0)
Anion gap: 11 (ref 5–15)
BUN: 50 mg/dL — AB (ref 6–20)
CALCIUM: 8.3 mg/dL — AB (ref 8.9–10.3)
CHLORIDE: 93 mmol/L — AB (ref 101–111)
CO2: 35 mmol/L — AB (ref 22–32)
CREATININE: 1.37 mg/dL — AB (ref 0.61–1.24)
GFR calc Af Amer: 56 mL/min — ABNORMAL LOW (ref >60)
GFR calc non Af Amer: 48 mL/min — ABNORMAL LOW (ref >60)
GLUCOSE: 106 mg/dL — AB (ref 65–99)
Potassium: 3.1 mmol/L — ABNORMAL LOW (ref 3.5–5.1)
SODIUM: 139 mmol/L (ref 135–145)
Total Bilirubin: 2.1 mg/dL — ABNORMAL HIGH (ref 0.3–1.2)
Total Protein: 6.6 g/dL (ref 6.5–8.1)

## 2017-01-23 LAB — PROTIME-INR
INR: 2.56
Prothrombin Time: 28 seconds — ABNORMAL HIGH (ref 11.4–15.2)

## 2017-01-23 LAB — MAGNESIUM: Magnesium: 1.9 mg/dL (ref 1.7–2.4)

## 2017-01-23 LAB — GLUCOSE, CAPILLARY
GLUCOSE-CAPILLARY: 179 mg/dL — AB (ref 65–99)
Glucose-Capillary: 113 mg/dL — ABNORMAL HIGH (ref 65–99)
Glucose-Capillary: 156 mg/dL — ABNORMAL HIGH (ref 65–99)
Glucose-Capillary: 216 mg/dL — ABNORMAL HIGH (ref 65–99)

## 2017-01-23 MED ORDER — DEXTROSE 50 % IV SOLN
INTRAVENOUS | Status: AC
  Filled 2017-01-23: qty 50

## 2017-01-23 MED ORDER — POTASSIUM CHLORIDE CRYS ER 20 MEQ PO TBCR
40.0000 meq | EXTENDED_RELEASE_TABLET | Freq: Two times a day (BID) | ORAL | Status: AC
Start: 2017-01-23 — End: 2017-01-23
  Administered 2017-01-23 (×2): 40 meq via ORAL
  Filled 2017-01-23 (×2): qty 2

## 2017-01-23 MED ORDER — FUROSEMIDE 10 MG/ML IJ SOLN
40.0000 mg | Freq: Two times a day (BID) | INTRAMUSCULAR | Status: DC
  Administered 2017-01-23 – 2017-01-24 (×2): 40 mg via INTRAVENOUS
  Filled 2017-01-23 (×2): qty 4

## 2017-01-23 NOTE — Progress Notes (Signed)
Patient had 14 Beat run SVT without symptoms Dr. Rhona Leavens and cardiology notified. Will continue to monitor and notify as needed.

## 2017-01-23 NOTE — Progress Notes (Signed)
PROGRESS NOTE    Bruce Price  OIL:[id number] DOB: 10/26/1940 DOA: 01/18/2017 PCP: Eloisa Northern, MD    Brief Narrative:  76 y.o. male with medical history significant of sCHF (per EDP, Dr. Drue Second, EF is 25%, no 2d ehco available in Epic), s/p of AICD, hypertension, hyperlipidemia, diabetes mellitus, gout, chronic venous insufficiency in legs, who presents with shortness of breath, leg edema, orthopnea, decreased urine output.  Pt is transferred from [facility] per EDP, Dr. Drue Second.  Pt states that he has been having shortness of breath, leg edema, orthopnea, decreased urine output for several days, which has been progressively getting worse today. Patient has dry cough, no chest pain, fever or chills. Has nausea, no vomiting, diarrhea, abdominal pain or symptoms of UTI. No unilateral weakness, slurred speech or facial droop. No hematuria, hematemesis or hematochezia.  Patient was initially evaluated in [facility]. He was found to have BNP 51400, troponin 0.10, digoix 2.1 (up normal limit is 2.0), WBC 9.8, hemoglobin 14.8, INR 8.0, lactic acid 7.1, negative urinalysis, potassium 5.4, creatinine is up from 1.7 to 3.0 per EDP, bicarbonate 18, AG 30, abnormal liver functions with ALP and 22, AST 191, ALT 113, total bilirubin 2.3, temperature normal, tachycardia with heart rate 112, respiration rate 18, oxygen saturation 95% on room air, blood pressure 176/69. Pt received 600 cc of NS in ED, his lactic acid level decreased from 7.1-->5.0.  Assessment & Plan:   Principal Problem:   Cardiogenic shock (HCC) Active Problems:   Essential hypertension   High cholesterol   Diabetes mellitus (HCC)   Atrial fibrillation (HCC)   Systolic CHF (HCC)   Acute on chronic systolic CHF (congestive heart failure) (HCC)   Elevated lactic acid level   Acute on chronic kidney failure (HCC)   Coagulopathy (HCC)   Abnormal LFTs   Acute respiratory distress   Chronic venous insufficiency  Presence of biventricular cardiac pacemaker   CHF exacerbation (HCC)   Cardiorenal syndrome with renal failure  Acute respiratory distress due to acute on chronic systolic CHF:  - Patient presented with elevated BNP 51400, bilateral leg edema and positive JVD, consistent with CHF exacerbation. Pt reported to reported to have EF is 25%. - Cardiology consulted. Appreciate in put. Recommendation to increase dose of lasix -Initially on Lasix 40 mg bid by IV, dose increased by Cardiology to 80mg  bid - Metoprolol 25mg  bid continued by Cardiology - Continuing to improve. Currently on minimal O2 support  Atrial Fibrillation and Coagulopathy due to coumadin: CHA2DS2-VASc Score is 5 , needs oral anticoagulation. Patient is on Coumadin at home. INR is 8.0 in ED. Heart rate is ~110s. No bleeding tendency.  -Pharmacy to dose coumadin - INR down to 2.56 -continue beta blocker as above -holding digoxin for now -Per cardiology, consideration for Eliquis when renal function normalizes. Given concurrent IV diuresis, will repeat bmet in AM to re-assess renal function before deciding on starting NOAC  HTN:  -Will continue metoprolol and doxazosin per above -Plan to continue with metoprolol per above  HLD:  - Last LDL was not on record -Held zocor due to abnormal liver function -Will repeat CMP in AM  DM-II:  - Last A1c not on record. Pt used to be on levemir, but currently patient is not taking meds at home (no DM meds in his med list from [place]). -Plan to continue SSI coverage  Elevated lactic acid level: - Patient without signs of infection.  - Suspect lactic acidosis secondary to tissue hypoxia  -  Currently stable  Acute on chronic CKD:  - Outpt labs reviewed. Baseline Cr of around 1-1.5 - Given concerns of volume overload, will continue aggressive diuresis. -Cr down to 1.37 - As Cr is improving and serum bicarb is 35, will decrease dose to 40mg  BID -Recheck renal panel in A  M  Abnormal LFTs: abnormal liver functions with ALP and 22, AST 191, ALT 113, total bilirubin 2.3. US-RUQ showed gallstone 2.5 cm, gallbladder wall thickening 26.5, this could be from hypoproteinemia per radiologist. Cholecystitis cannot be excluded completely. Mild ascites. Pt dose not have fever or leukocytosis or AP, dose not seem to have cholecystitis -Possible hepatic congestion secondary to acute heart failure -Continuing to hold Zocor - LFT's improving - Repeat CMP in AM  Chronic venous insufficiency and skin lesions in legs. -Have consulted to wound care with recommendations noted  Hyperkalemia: No EKG change. -Patient did not tolerate kayexelate enema - Resolved with diuresis  LUE DVT with arm swelling  - LUE DVT noted on UE doppler - Will cont with coumadin per pharmacy dosing - Suspect related to pacer wires - VQ w/ very low likelihood of PE  DVT prophylaxis: coumadin Code Status: DNR Family Communication: Pt in room, updated pt's son and brother Disposition Plan: SNF, timing uncertain  Consultants:   Cardiology  Procedures:     Antimicrobials: Anti-infectives    None      Subjective: Without complaints  Objective: Vitals:   01/22/17 1340 01/22/17 2225 01/23/17 0430 01/23/17 1021  BP: 101/64 96/65 100/67 108/62  Pulse: (!) 59 (!) 119 (!) 118 (!) 120  Resp: 18 20 20    Temp:  97.8 F (36.6 C) 97.6 F (36.4 C) 97.7 F (36.5 C)  TempSrc:  Oral Oral Oral  SpO2: 98% 97% 95% 95%  Weight:      Height:        Intake/Output Summary (Last 24 hours) at 01/23/17 1437 Last data filed at 01/23/17 1030  Gross per 24 hour  Intake              363 ml  Output             2025 ml  Net            -1662 ml   Filed Weights   01/20/17 0500 01/20/17 2120 01/22/17 0532  Weight: 101.1 kg (222 lb 14.2 oz) 98.5 kg (217 lb 2.5 oz) 43.7 kg (96 lb 4.8 oz)    Examination:  General exam: conversant, awake, in nad Respiratory system: normal chest rise, no wheezing  on auscultation Cardiovascular system: regular rhythm, s1, s2 on auscultation Gastrointestinal system: soft, nondistended, pos BS Central nervous system: no seizures, no tremors Extremities: no cyanosis, no joint deformities Skin: no rashes, no pallor Psychiatry: affect normal// no auditory hallucinations  Data Reviewed: I have personally reviewed following labs and imaging studies  CBC:  Recent Labs Lab 01/20/17 0425  WBC 13.2*  HGB 13.5  HCT 40.7  MCV 72.4*  PLT 237   Basic Metabolic Panel:  Recent Labs Lab 01/19/17 0858 01/20/17 0425 01/21/17 0551 01/22/17 0543 01/23/17 0553  NA 135 135 140 136 139  K 5.5* 5.3* 3.8 3.7 3.1*  CL 93* 96* 97* 93* 93*  CO2 22 27 30  32 35*  GLUCOSE 147* 107* 132* 167* 106*  BUN 67* 70* 70* 57* 50*  CREATININE 3.21* 3.04* 2.32* 1.73* 1.37*  CALCIUM 8.8* 8.2* 8.3* 8.3* 8.3*  MG  --  2.3  --   --  1.9   GFR: Estimated Creatinine Clearance: 27.9 mL/min (A) (by C-G formula based on SCr of 1.37 mg/dL (H)). Liver Function Tests:  Recent Labs Lab 01/22/17 0543 01/23/17 0553  AST 173* 118*  ALT 189* 150*  ALKPHOS 78 83  BILITOT 2.0* 2.1*  PROT 6.3* 6.6  ALBUMIN 2.9* 2.9*   No results for input(s): LIPASE, AMYLASE in the last 168 hours. No results for input(s): AMMONIA in the last 168 hours. Coagulation Profile:  Recent Labs Lab 01/19/17 0655 01/20/17 0525 01/21/17 0551 01/22/17 0543 01/23/17 0553  INR 7.22* 5.93* 6.09* 3.23 2.56   Cardiac Enzymes:  Recent Labs Lab 01/19/17 0022 01/19/17 0655 01/19/17 1130  TROPONINI 0.06* 0.06* 0.06*   BNP (last 3 results) No results for input(s): PROBNP in the last 8760 hours. HbA1C: No results for input(s): HGBA1C in the last 72 hours. CBG:  Recent Labs Lab 01/22/17 1114 01/22/17 1556 01/22/17 2226 01/23/17 0732 01/23/17 1219  GLUCAP 229* 265* 102* 113* 156*   Lipid Profile: No results for input(s): CHOL, HDL, LDLCALC, TRIG, CHOLHDL, LDLDIRECT in the last 72  hours. Thyroid Function Tests: No results for input(s): TSH, T4TOTAL, FREET4, T3FREE, THYROIDAB in the last 72 hours. Anemia Panel: No results for input(s): VITAMINB12, FOLATE, FERRITIN, TIBC, IRON, RETICCTPCT in the last 72 hours. Sepsis Labs:  Recent Labs Lab 01/19/17 0022  LATICACIDVEN 4.5*    Recent Results (from the past 240 hour(s))  MRSA PCR Screening     Status: None   Collection Time: 01/18/17  9:47 PM  Result Value Ref Range Status   MRSA by PCR NEGATIVE NEGATIVE Final    Comment:        The GeneXpert MRSA Assay (FDA approved for NASAL specimens only), is one component of a comprehensive MRSA colonization surveillance program. It is not intended to diagnose MRSA infection nor to guide or monitor treatment for MRSA infections.      Radiology Studies: No results found.  Scheduled Meds: . doxazosin  4 mg Oral Daily  . folic acid  1 mg Oral Daily  . furosemide  40 mg Intravenous BID  . insulin aspart  0-5 Units Subcutaneous QHS  . insulin aspart  0-9 Units Subcutaneous TID WC  . metoprolol tartrate  50 mg Oral BID  . potassium chloride  40 mEq Oral BID  . sodium chloride flush  3 mL Intravenous Q12H   Continuous Infusions: . sodium chloride       LOS: 5 days   Kamylle Axelson, Scheryl Marten, MD [facility] Pager [phone]  If 7PM-7AM, please contact night-coverage [email or web address] Password [facility] 01/23/2017, 2:37 PM

## 2017-01-23 NOTE — Progress Notes (Signed)
ANTICOAGULATION CONSULT NOTE  Pharmacy Consult for warfarin Indication: atrial fibrillation  No Known Allergies  Patient Measurements: Height: 6' (182.9 cm) Weight: 96 lb 4.8 oz (43.7 kg) IBW/kg (Calculated) : 77.6  Vital Signs: Temp: 97.7 F (36.5 C) (04/29 1021) Temp Source: Oral (04/29 1021) BP: 108/62 (04/29 1021) Pulse Rate: 120 (04/29 1021)  Labs:  Recent Labs  01/21/17 0551 01/22/17 0543 01/23/17 0553  LABPROT 56.0* 33.7* 28.0*  INR 6.09* 3.23 2.56  CREATININE 2.32* 1.73* 1.37*    Estimated Creatinine Clearance: 27.9 mL/min (A) (by C-G formula based on SCr of 1.37 mg/dL (H)).   Medical History: Past Medical History:  Diagnosis Date  . Atrial fibrillation (HCC)   . Diabetes mellitus   . High blood pressure   . High cholesterol   . Systolic CHF (HCC)     Medications:  Prescriptions Prior to Admission  Medication Sig Dispense Refill Last Dose  . benazepril (LOTENSIN) 5 MG tablet Take 5 mg by mouth daily.  0 01/17/2017 at Unknown time  . digoxin (LANOXIN) 0.125 MG tablet Take 125 mcg by mouth daily.  0 01/17/2017 at Unknown time  . fish oil-omega-3 fatty acids 1000 MG capsule Take 1 g by mouth daily.    01/17/2017 at Unknown time  . folic acid (FOLVITE) 1 MG tablet Take 1 mg by mouth daily.   01/17/2017 at Unknown time  . furosemide (LASIX) 40 MG tablet Take 40-80 mg by mouth 2 (two) times daily. 80 MG in the morning and 40 MG in the afternoon   01/17/2017 at Unknown time  . metoprolol tartrate (LOPRESSOR) 25 MG tablet Take 50 mg by mouth 2 (two) times daily.  0 01/17/2017 at Unknown time  . simvastatin (ZOCOR) 80 MG tablet Take 80 mg by mouth daily.   01/17/2017 at Unknown time  . warfarin (COUMADIN) 5 MG tablet Take 1 tablet by mouth daily.   01/17/2017 at Unknown time   Scheduled:  . doxazosin  4 mg Oral Daily  . folic acid  1 mg Oral Daily  . furosemide  40 mg Intravenous BID  . insulin aspart  0-5 Units Subcutaneous QHS  . insulin aspart  0-9 Units  Subcutaneous TID WC  . metoprolol tartrate  50 mg Oral BID  . potassium chloride  40 mEq Oral BID  . sodium chloride flush  3 mL Intravenous Q12H   Infusions:  . sodium chloride      Assessment: 37 yoM with PMH AFib, CHF w/ AICD, HTN, HLD, DM, gout, chronic venous insufficiency of legs, who presents with SOB, LE edema, and oliguria. Initially presented to [facility] where INR was found to supratherapeutic. Patient later transferred to [facility] SDU for AECHF. Pharmacy consulted to dose warfarin while admitted   Baseline INR elevated at 7.2  Prior anticoagulation: warfarin 5 mg daily  Significant events: 4/26 Left upper extremity doppler: new DVT  Today, 01/23/2017:   INR remains now in therapeutic range at 2.56.  Elevation felt likely d/t hepatic congestion from AECHF.  Cards suspects new DVT is probably secondary to pacemaker wire and elevation in INR is likely an acute event d/t nutritional status & pt may not have been at goal range before. They recommend considering switch to Eliquis (would wait until INR < 2). TRH awaiting improvement in renal function before transition.  Last CBC on 4/26 stable  No bleeding documeted  Major drug interactions: none  On regular diet  Goal of Therapy: INR 2-3  Plan:  Continue to  hold warfarin tonight pending plan for oral anticoagulant choice (see above)  Daily INR  Monitor for signs of bleeding or thrombosis  Loralee Pacas, PharmD, BCPS Pager: [phone] 01/23/2017 1:58 PM

## 2017-01-24 ENCOUNTER — Encounter (HOSPITAL_COMMUNITY): Payer: Medicare Other

## 2017-01-24 DIAGNOSIS — M7989 Other specified soft tissue disorders: Secondary | ICD-10-CM

## 2017-01-24 DIAGNOSIS — L8990 Pressure ulcer of unspecified site, unspecified stage: Secondary | ICD-10-CM | POA: Insufficient documentation

## 2017-01-24 LAB — COMPREHENSIVE METABOLIC PANEL
ALBUMIN: 2.9 g/dL — AB (ref 3.5–5.0)
ALT: 115 U/L — ABNORMAL HIGH (ref 17–63)
ANION GAP: 12 (ref 5–15)
AST: 85 U/L — AB (ref 15–41)
Alkaline Phosphatase: 73 U/L (ref 38–126)
BUN: 50 mg/dL — AB (ref 6–20)
CHLORIDE: 90 mmol/L — AB (ref 101–111)
CO2: 32 mmol/L (ref 22–32)
Calcium: 8.2 mg/dL — ABNORMAL LOW (ref 8.9–10.3)
Creatinine, Ser: 1.37 mg/dL — ABNORMAL HIGH (ref 0.61–1.24)
GFR calc Af Amer: 56 mL/min — ABNORMAL LOW (ref >60)
GFR calc non Af Amer: 48 mL/min — ABNORMAL LOW (ref >60)
GLUCOSE: 167 mg/dL — AB (ref 65–99)
POTASSIUM: 4.2 mmol/L (ref 3.5–5.1)
SODIUM: 134 mmol/L — AB (ref 135–145)
Total Bilirubin: 2.3 mg/dL — ABNORMAL HIGH (ref 0.3–1.2)
Total Protein: 6.5 g/dL (ref 6.5–8.1)

## 2017-01-24 LAB — PROTIME-INR
INR: 2.17
PROTHROMBIN TIME: 24.5 s — AB (ref 11.4–15.2)

## 2017-01-24 LAB — GLUCOSE, CAPILLARY
GLUCOSE-CAPILLARY: 225 mg/dL — AB (ref 65–99)
Glucose-Capillary: 146 mg/dL — ABNORMAL HIGH (ref 65–99)
Glucose-Capillary: 193 mg/dL — ABNORMAL HIGH (ref 65–99)

## 2017-01-24 MED ORDER — WARFARIN SODIUM 2 MG PO TABS: 2.0000 mg | ORAL_TABLET | Freq: Once | ORAL | Status: DC

## 2017-01-24 MED ORDER — FUROSEMIDE 40 MG PO TABS
80.0000 mg | ORAL_TABLET | Freq: Two times a day (BID) | ORAL | Status: DC
  Administered 2017-01-24 – 2017-01-25 (×3): 80 mg via ORAL
  Filled 2017-01-24 (×3): qty 2

## 2017-01-24 MED ORDER — WARFARIN - PHARMACIST DOSING INPATIENT: Freq: Every day | Status: DC

## 2017-01-24 NOTE — Progress Notes (Signed)
Progress Note  Patient Name: Bruce Price Date of Encounter: 01/24/2017  Primary Cardiologist: Dr. Curlene Labrum  Subjective   Patient is feeling well; denies chest pain, SOB, and palpitations.   Inpatient Medications    Scheduled Meds: . doxazosin  4 mg Oral Daily  . folic acid  1 mg Oral Daily  . furosemide  40 mg Intravenous BID  . insulin aspart  0-5 Units Subcutaneous QHS  . insulin aspart  0-9 Units Subcutaneous TID WC  . metoprolol tartrate  50 mg Oral BID  . sodium chloride flush  3 mL Intravenous Q12H   Continuous Infusions: . sodium chloride     PRN Meds: sodium chloride, ondansetron (ZOFRAN) IV, sodium chloride flush   Vital Signs    Vitals:   01/23/17 1021 01/23/17 1500 01/23/17 2051 01/24/17 0652  BP: 108/62 96/65 (!) 91/57 107/63  Pulse: (!) 120 (!) 119 61 60  Resp:  20 16 18   Temp: 97.7 F (36.5 C) 97.5 F (36.4 C) 97.6 F (36.4 C) 97.9 F (36.6 C)  TempSrc: Oral Oral Oral Oral  SpO2: 95% 96% 98% 97%  Weight:      Height:        Intake/Output Summary (Last 24 hours) at 01/24/17 0757 Last data filed at 01/23/17 1530  Gross per 24 hour  Intake              483 ml  Output              875 ml  Net             -392 ml   Filed Weights   01/20/17 0500 01/20/17 2120 01/22/17 0532  Weight: 222 lb 14.2 oz (101.1 kg) 217 lb 2.5 oz (98.5 kg) 96 lb 4.8 oz (43.7 kg)     Physical Exam   General: Well developed, well nourished, male appearing in no acute distress. Head: Normocephalic, atraumatic.  Neck: Supple without bruits, no JVD. Lungs:  Resp regular and unlabored, CTA. Heart: Irregular rhythm, regular rate, no murmur; no rub. Abdomen: Soft, non-tender, non-distended with normoactive bowel sounds. No hepatomegaly. No rebound/guarding. No obvious abdominal masses. Extremities: No clubbing, cyanosis, trace edema. Distal pedal pulses are faint bilaterally. Neuro: Alert and oriented X 3. Moves all extremities spontaneously. Psych: Normal  affect.  Labs    Chemistry Recent Labs Lab 01/22/17 0543 01/23/17 0553 01/24/17 0632  NA 136 139 134*  K 3.7 3.1* 4.2  CL 93* 93* 90*  CO2 32 35* 32  GLUCOSE 167* 106* 167*  BUN 57* 50* 50*  CREATININE 1.73* 1.37* 1.37*  CALCIUM 8.3* 8.3* 8.2*  PROT 6.3* 6.6 6.5  ALBUMIN 2.9* 2.9* 2.9*  AST 173* 118* 85*  ALT 189* 150* 115*  ALKPHOS 78 83 73  BILITOT 2.0* 2.1* 2.3*  GFRNONAA 36* 48* 48*  GFRAA 42* 56* 56*  ANIONGAP 11 11 12      Hematology Recent Labs Lab 01/20/17 0425  WBC 13.2*  RBC 5.62  HGB 13.5  HCT 40.7  MCV 72.4*  MCH 24.0*  MCHC 33.2  RDW 16.6*  PLT 237    Cardiac Enzymes Recent Labs Lab 01/19/17 0022 01/19/17 0655 01/19/17 1130  TROPONINI 0.06* 0.06* 0.06*   No results for input(s): TROPIPOC in the last 168 hours.   BNP Recent Labs Lab 01/19/17 1130  BNP 3,881.3*     DDimer No results for input(s): DDIMER in the last 168 hours.   Radiology    No results found.  Telemetry    Pacing in the 60s with frequent PVCs; overnight 6 and 9 beat runs of Vtach - Personally Reviewed  ECG    No new tracings - Personally Reviewed  Cardiac Studies   Echocardiogram 01/20/17: Study Conclusions - Left ventricle: The cavity size was severely dilated. There was   moderate concentric hypertrophy. Systolic function was severely   reduced. The estimated ejection fraction was in the range of 25%   to 30%. Severe diffuse hypokinesis with no identifiable regional   variations. There was a reduced contribution of atrial   contraction to ventricular filling, due to increased ventricular   diastolic pressure or atrial contractile dysfunction. Doppler   parameters are consistent with a reversible restrictive pattern,   indicative of decreased left ventricular diastolic compliance   and/or increased left atrial pressure (grade 3 diastolic   dysfunction). Doppler parameters are consistent with high   ventricular filling pressure. - Mitral valve:  Calcified annulus. There was moderate   regurgitation. - Left atrium: The atrium was severely dilated. - Right ventricle: The cavity size was severely dilated. Wall   thickness was normal. - Right atrium: The atrium was severely dilated. - Tricuspid valve: There was moderate regurgitation. - Pulmonary arteries: PA peak pressure: 52 mm Hg (S). - Pericardium, extracardiac: A trivial pericardial effusion was   identified posterior to the heart.  Impressions: - The right ventricular systolic pressure was increased consistent   with moderate pulmonary hypertension.  Patient Profile    76 y.o. male history of NICM and PAF on Coumadin. He had a cath in 2016- 30% mLAD 25%. He then had a BiV pacemaker implanted in Aug 2017 after he presented to [facility] with bradycardia. Other medical history includes chronic venous edema and venous ulcer.  He was admitted to [facility] 01/18/17 as a transfer from [facility] in acute respiratory distress after he presented there with SOB. He was found to have a BNP of 5150, elevated LFTs, an INR of 8, K+ 5.4, and a SCr of 3.0 (up from 1.7). He is a DNR and aggressive measures (IV inotropics) are not felt to be appropriate.  Assessment & Plan     1. Acute on chronic systolic and diastolic heart failure, NICM - overall net negative 11L, continue strict I&Os; admission weight was 222 lbs, need updated weight today.  - consider transitioning to PO lasix - sCr stable at 1.37 (1.37), K WNL - telemetry review pacing in the 60s with frequent PVCs - Pt is DNR, medical management only - continue lopressor, runs of Vtach have decreased   2. PAF with RVR - currently pacing with runs of Vtach - continue lopressor today   3. CAD - 30% mLAD at cath Oct 2016 (Dr Dot Been in [facility]) after an abnormal stress  - medical management   4. Acute on chronic renal insufficiency - sCr stable 1.37 with good urine output - baseline sCr difficult to determine   5.  Chronic  anticoagulation - CHA2Ds2 VASc=5, per Dr Concha Norway. Pt is on Coumadin with supra therapeutic INR on admission- 7.22.  INR improving: 2.17 (2.56) today - coumadin per pharmacy and primary team     Signed, Marcelino Duster , PA-C 7:57 AM 01/24/2017 Pager: [phone]

## 2017-01-24 NOTE — Progress Notes (Signed)
Physical Therapy Treatment Patient Details Name: Bruce Price MRN: [id number] DOB: [birth year] Today's Date: 01/24/2017    History of Present Illness 76 y.o. male with medical history significant of sCHF (per EDP, Dr. Drue Second, EF is 25%, no 2d ehco available in Epic), s/p of AICD, hypertension, hyperlipidemia, diabetes mellitus, gout, chronic venous insufficiency in legs, who presents with shortness of breath, leg edema, orthopnea, decreased urine output. Dx of respiratory distress, acute on chronic CHF, elevated INR, occlusive LUE DVT (likely due to pacemaker implantation per MD).     PT Comments    Pt on BSC with NT upon entering, NT requesting assist for back to bed.  Pt continues to require mod assist for transfers and fatigues quickly.  Pt performed a couple exercises once back to bed however declined further activity due to fatigue.    Follow Up Recommendations  SNF     Equipment Recommendations  Other (comment) (TBD next venue)    Recommendations for Other Services       Precautions / Restrictions Precautions Precautions: Fall Precaution Comments: 1 fall 3 months ago, tends to slide out of bed to floor per family Restrictions Weight Bearing Restrictions: No    Mobility  Bed Mobility Overal bed mobility: Needs Assistance Bed Mobility: Sit to Supine       Sit to supine: Mod assist   General bed mobility comments: assist for LEs onto bed  Transfers Overall transfer level: Needs assistance Equipment used: Rolling walker (2 wheeled) Transfers: Sit to/from [employer] Sit to Stand: Mod assist;+2 physical assistance Stand pivot transfers: Mod assist;+2 physical assistance       General transfer comment: pt on BSC upon entering room, NT reports pt was able to get to [facility] well however increased difficulty getting up from [facility] and requested assist, pt overall mod assist for rise, steady and controlling descent, max multimodal cues provided for safe  technique including attempting to come quickly into knee extension (tends to slide forward with transfer and keep knees in flexion, able to improve with cues provided)  Ambulation/Gait                 Stairs            Wheelchair Mobility    Modified Rankin (Stroke Patients Only)       Balance                                            Cognition Arousal/Alertness: Awake/alert Behavior During Therapy: WFL for tasks assessed/performed Overall Cognitive Status: Impaired/Different from baseline Area of Impairment: Following commands                       Following Commands: Follows one step commands consistently       General Comments: multimodal cues needed      Exercises General Exercises - Lower Extremity Long Arc Quad: AROM;Both;10 reps;Seated Straight Leg Raises: AROM;Both;10 reps;Supine Hip Flexion/Marching: AROM;Both;10 reps;Seated    General Comments        Pertinent Vitals/Pain Pain Assessment: No/denies pain  HR in 60s during session    Home Living                      Prior Function            PT Goals (current goals can now be found  in the care plan section) Progress towards PT goals: Progressing toward goals    Frequency    Min 3X/week      PT Plan Current plan remains appropriate    Co-evaluation              AM-PAC PT "6 Clicks" Daily Activity  Outcome Measure  Difficulty turning over in bed (including adjusting bedclothes, sheets and blankets)?: A Little Difficulty moving from lying on back to sitting on the side of the bed? : A Lot Difficulty sitting down on and standing up from a chair with arms (e.g., wheelchair, bedside commode, etc,.)?: A Lot Help needed moving to and from a bed to chair (including a wheelchair)?: A Lot Help needed walking in hospital room?: Total Help needed climbing 3-5 steps with a railing? : Total 6 Click Score: 11    End of Session Equipment  Utilized During Treatment: Gait belt Activity Tolerance: Patient limited by fatigue Patient left: with call bell/phone within reach;in bed;with bed alarm set   PT Visit Diagnosis: History of falling (Z91.81);Difficulty in walking, not elsewhere classified (R26.2);Muscle weakness (generalized) (M62.81)     Time: 2992-4268 PT Time Calculation (min) (ACUTE ONLY): 10 min  Charges:  $Therapeutic Activity: 8-22 mins                    G Codes:       Zenovia Jarred, PT, DPT 01/24/2017 Pager: [phone]    Maida Sale E 01/24/2017, 12:02 PM

## 2017-01-24 NOTE — Progress Notes (Signed)
PROGRESS NOTE    Bruce Price  ZTI:[id number] DOB: Nov 22, 1940 DOA: 01/18/2017 PCP: Eloisa Northern, MD    Brief Narrative:  76 y.o. male with medical history significant of sCHF (per EDP, Dr. Drue Second, EF is 25%, no 2d ehco available in Epic), s/p of AICD, hypertension, hyperlipidemia, diabetes mellitus, gout, chronic venous insufficiency in legs, who presents with shortness of breath, leg edema, orthopnea, decreased urine output.  Pt is transferred from [facility] per EDP, Dr. Drue Second.  Pt states that he has been having shortness of breath, leg edema, orthopnea, decreased urine output for several days, which has been progressively getting worse today. Patient has dry cough, no chest pain, fever or chills. Has nausea, no vomiting, diarrhea, abdominal pain or symptoms of UTI. No unilateral weakness, slurred speech or facial droop. No hematuria, hematemesis or hematochezia.  Patient was initially evaluated in [facility]. He was found to have BNP 51400, troponin 0.10, digoix 2.1 (up normal limit is 2.0), WBC 9.8, hemoglobin 14.8, INR 8.0, lactic acid 7.1, negative urinalysis, potassium 5.4, creatinine is up from 1.7 to 3.0 per EDP, bicarbonate 18, AG 30, abnormal liver functions with ALP and 22, AST 191, ALT 113, total bilirubin 2.3, temperature normal, tachycardia with heart rate 112, respiration rate 18, oxygen saturation 95% on room air, blood pressure 176/69. Pt received 600 cc of NS in ED, his lactic acid level decreased from 7.1-->5.0.  Assessment & Plan:   Principal Problem:   Cardiogenic shock (HCC) Active Problems:   Essential hypertension   High cholesterol   Diabetes mellitus (HCC)   Atrial fibrillation (HCC)   Systolic CHF (HCC)   Acute on chronic systolic CHF (congestive heart failure) (HCC)   Elevated lactic acid level   Acute on chronic kidney failure (HCC)   Coagulopathy (HCC)   Abnormal LFTs   Acute respiratory distress   Chronic venous insufficiency  Presence of biventricular cardiac pacemaker   CHF exacerbation (HCC)   Cardiorenal syndrome with renal failure   Pressure injury of skin   Swelling of left upper extremity  Acute respiratory distress due to acute on chronic systolic CHF:  - Patient presented with elevated BNP 51400, bilateral leg edema and positive JVD, consistent with CHF exacerbation. Pt reported to reported to have EF is 25%. - Cardiology consulted. Appreciate in put. Recommendation to increase dose of lasix -Initially on Lasix 40 mg bid by IV, dose increased by Cardiology to 80mg  bid - Metoprolol 25mg  bid will be continued by Cardiology - Currently on minimal O2 support and clinically much improved since admission  Atrial Fibrillation and Coagulopathy due to coumadin: CHA2DS2-VASc Score is 5 , needs oral anticoagulation. Patient is on Coumadin at home. INR is 8.0 in ED. Heart rate is ~110s. No bleeding tendency. - INR down to 2.1 -continue beta blocker as above -holding digoxin for now -Renal function has stabilized. Discussed case with Cardiology, who recommends starting Eliquis. Plan to start when INR<2  HTN:  -Will continue metoprolol and doxazosin per above -Plan to continue with metoprolol per above - Stable  HLD:  - Last LDL was not on record -Held zocor due to abnormal liver function -Recheck cmp in AM  DM-II:  - Last A1c not on record. Pt used to be on levemir, but currently patient is not taking meds at home (no DM meds in his med list from [place]). -Will continue SSI coverage  Elevated lactic acid level: - Patient without signs of infection.  - Suspect lactic acidosis secondary to tissue  hypoxia  - Presently stable  Acute on chronic CKD:  - Outpt labs reviewed. Baseline Cr of around 1-1.5 - Given concerns of volume overload, will continue aggressive diuresis. -Cr has improved to 1.37 since admit -Will recheck bmet in AM  Abnormal LFTs: abnormal liver functions with ALP and 22, AST  191, ALT 113, total bilirubin 2.3. US-RUQ showed gallstone 2.5 cm, gallbladder wall thickening 26.5, this could be from hypoproteinemia per radiologist. Cholecystitis cannot be excluded completely. Mild ascites. Pt dose not have fever or leukocytosis or AP, dose not seem to have cholecystitis -Possible hepatic congestion secondary to acute heart failure -Continuing to hold Zocor - LFT's are improving - Will repeat CMP in AM  Chronic venous insufficiency and skin lesions in legs. -Have consulted to wound care with recommendations noted  Hyperkalemia: No EKG change. -Patient did not tolerate kayexelate enema - Currently resolved with diuresis  LUE DVT with arm swelling  - LUE DVT noted on UE doppler - Will cont with coumadin per pharmacy dosing - Suspect related to pacer wires - VQ w/ very low likelihood of PE - Stable. Advised family not to "massage" arm, as suggested by pt's wife  DVT prophylaxis: coumadin Code Status: DNR Family Communication: Pt in room, updated pt's son over phone Disposition Plan: SNF,anticipate 5/1  Consultants:   Cardiology  Procedures:     Antimicrobials: Anti-infectives    None      Subjective: No complaints at present  Objective: Vitals:   01/23/17 2051 01/24/17 0652 01/24/17 1049 01/24/17 1300  BP: (!) 91/57 107/63  (!) 98/59  Pulse: 61 60  (!) 59  Resp: 16 18  18   Temp: 97.6 F (36.4 C) 97.9 F (36.6 C)  97.6 F (36.4 C)  TempSrc: Oral Oral  Oral  SpO2: 98% 97%  98%  Weight:   99.9 kg (220 lb 3.8 oz)   Height:        Intake/Output Summary (Last 24 hours) at 01/24/17 1701 Last data filed at 01/24/17 1403  Gross per 24 hour  Intake              360 ml  Output              475 ml  Net             -115 ml   Filed Weights   01/20/17 2120 01/22/17 0532 01/24/17 1049  Weight: 98.5 kg (217 lb 2.5 oz) 43.7 kg (96 lb 4.8 oz) 99.9 kg (220 lb 3.8 oz)    Examination:  General exam: laying in bed, awake, in nad Respiratory  system: normal resp effort, no audible wheezing Cardiovascular system: regular rate, s1-2 auscultated Gastrointestinal system: nondistended, pos BS, nontender Central nervous system: cn2-12 grossly intact, strength intact Extremities: no clubbing, perfused Skin: normal skin turgor, no notable skin lesions seen Psychiatry: mood normal// no visual hallucinations  Data Reviewed: I have personally reviewed following labs and imaging studies  CBC:  Recent Labs Lab 01/20/17 0425  WBC 13.2*  HGB 13.5  HCT 40.7  MCV 72.4*  PLT 237   Basic Metabolic Panel:  Recent Labs Lab 01/20/17 0425 01/21/17 0551 01/22/17 0543 01/23/17 0553 01/24/17 0632  NA 135 140 136 139 134*  K 5.3* 3.8 3.7 3.1* 4.2  CL 96* 97* 93* 93* 90*  CO2 27 30 32 35* 32  GLUCOSE 107* 132* 167* 106* 167*  BUN 70* 70* 57* 50* 50*  CREATININE 3.04* 2.32* 1.73* 1.37* 1.37*  CALCIUM 8.2* 8.3*  8.3* 8.3* 8.2*  MG 2.3  --   --  1.9  --    GFR: Estimated Creatinine Clearance: 55.2 mL/min (A) (by C-G formula based on SCr of 1.37 mg/dL (H)). Liver Function Tests:  Recent Labs Lab 01/22/17 0543 01/23/17 0553 01/24/17 0632  AST 173* 118* 85*  ALT 189* 150* 115*  ALKPHOS 78 83 73  BILITOT 2.0* 2.1* 2.3*  PROT 6.3* 6.6 6.5  ALBUMIN 2.9* 2.9* 2.9*   No results for input(s): LIPASE, AMYLASE in the last 168 hours. No results for input(s): AMMONIA in the last 168 hours. Coagulation Profile:  Recent Labs Lab 01/20/17 0525 01/21/17 0551 01/22/17 0543 01/23/17 0553 01/24/17 0632  INR 5.93* 6.09* 3.23 2.56 2.17   Cardiac Enzymes:  Recent Labs Lab 01/19/17 0022 01/19/17 0655 01/19/17 1130  TROPONINI 0.06* 0.06* 0.06*   BNP (last 3 results) No results for input(s): PROBNP in the last 8760 hours. HbA1C: No results for input(s): HGBA1C in the last 72 hours. CBG:  Recent Labs Lab 01/23/17 1219 01/23/17 1634 01/23/17 2310 01/24/17 0725 01/24/17 1206  GLUCAP 156* 216* 179* 146* 193*   Lipid  Profile: No results for input(s): CHOL, HDL, LDLCALC, TRIG, CHOLHDL, LDLDIRECT in the last 72 hours. Thyroid Function Tests: No results for input(s): TSH, T4TOTAL, FREET4, T3FREE, THYROIDAB in the last 72 hours. Anemia Panel: No results for input(s): VITAMINB12, FOLATE, FERRITIN, TIBC, IRON, RETICCTPCT in the last 72 hours. Sepsis Labs:  Recent Labs Lab 01/19/17 0022  LATICACIDVEN 4.5*    Recent Results (from the past 240 hour(s))  MRSA PCR Screening     Status: None   Collection Time: 01/18/17  9:47 PM  Result Value Ref Range Status   MRSA by PCR NEGATIVE NEGATIVE Final    Comment:        The GeneXpert MRSA Assay (FDA approved for NASAL specimens only), is one component of a comprehensive MRSA colonization surveillance program. It is not intended to diagnose MRSA infection nor to guide or monitor treatment for MRSA infections.      Radiology Studies: No results found.  Scheduled Meds: . doxazosin  4 mg Oral Daily  . folic acid  1 mg Oral Daily  . furosemide  80 mg Oral BID  . insulin aspart  0-5 Units Subcutaneous QHS  . insulin aspart  0-9 Units Subcutaneous TID WC  . metoprolol tartrate  50 mg Oral BID  . sodium chloride flush  3 mL Intravenous Q12H   Continuous Infusions: . sodium chloride       LOS: 6 days   CHIU, Scheryl Marten, MD [facility] Pager [phone]  If 7PM-7AM, please contact night-coverage [email or web address] Password [email or web address] 01/24/2017, 5:01 PM

## 2017-01-24 NOTE — NC FL2 (Signed)
[place] MEDICAID FL2 LEVEL OF CARE SCREENING TOOL     IDENTIFICATION  Patient Name: Bruce Price Birthdate: 08-30-1941 Sex: male Admission Date (Current Location): 01/18/2017  [facility] and [place] Number:  Producer, television/film/video and Address:  [facility],  [address] [place], [place] [address]      Provider Number: [id number]  Attending Physician Name and Address:  Jerald Kief, MD  Relative Name and Phone Number:       Current Level of Care: Hospital Recommended Level of Care: Skilled Nursing Facility Prior Approval Number:    Date Approved/Denied:   PASRR Number: [id number]  Discharge Plan: SNF    Current Diagnoses: Patient Active Problem List   Diagnosis Date Noted  . Pressure injury of skin 01/24/2017  . Swelling of left upper extremity   . Cardiogenic shock (HCC) 01/19/2017  . Cardiorenal syndrome with renal failure 01/19/2017  . Acute on chronic systolic CHF (congestive heart failure) (HCC) 01/18/2017  . Elevated lactic acid level 01/18/2017  . Acute on chronic kidney failure (HCC) 01/18/2017  . Coagulopathy (HCC) 01/18/2017  . Abnormal LFTs 01/18/2017  . Acute respiratory distress 01/18/2017  . Chronic venous insufficiency 01/18/2017  . Presence of biventricular cardiac pacemaker 01/18/2017  . CHF exacerbation (HCC) 01/18/2017  . Atrial fibrillation (HCC)   . Systolic CHF (HCC)   . Dyspnea 03/02/2012  . High cholesterol 04/29/2011  . Diabetes mellitus (HCC) 04/29/2011  . Onychomycosis 04/29/2011  . Essential hypertension 11/12/2010  . PLEURAL EFFUSION, RIGHT 11/12/2010    Orientation RESPIRATION BLADDER Height & Weight     Self, Place  Normal Continent, External catheter Weight: 220 lb 3.8 oz (99.9 kg) Height:  6' (182.9 cm)  BEHAVIORAL SYMPTOMS/MOOD NEUROLOGICAL BOWEL NUTRITION STATUS      Continent Diet (heart healthy, low carb)  AMBULATORY STATUS COMMUNICATION OF NEEDS Skin   Extensive Assist Verbally PU Stage and Appropriate  Care (Pressure injury stage II)   Wound type: Left anterior calf; partial thickness wounds .5X.5X.1cm and 2X2X.1cm; both sites pink and moist, no odor, small amt yellow drainage Right anterior leg with healing full thickness wounds; 5X4X.1cm and .8X.8X.1cm, both pink and dry without odor or drainage.  Dressing procedure/placement/frequency: Foam dressings to protect and promote healing.                     Personal Care Assistance Level of Assistance  Bathing, Dressing, Feeding Bathing Assistance: Limited assistance Feeding assistance: Independent Dressing Assistance: Limited assistance     Functional Limitations Info  Sight, Hearing, Speech Sight Info: Adequate Hearing Info: Adequate Speech Info: Adequate    SPECIAL CARE FACTORS FREQUENCY  PT (By licensed PT), OT (By licensed OT)     PT Frequency: 5x OT Frequency: 5x            Contractures Contractures Info: Not present    Additional Factors Info  Code Status, Allergies Code Status Info: DNR Allergies Info: nka           Current Medications (01/24/2017):  This is the current hospital active medication list Current Facility-Administered Medications  Medication Dose Route Frequency Provider Last Rate Last Dose  . 0.9 %  sodium chloride infusion  250 mL Intravenous PRN Lorretta Harp, MD      . doxazosin (CARDURA) tablet 4 mg  4 mg Oral Daily Lorretta Harp, MD   4 mg at 01/24/17 0932  . folic acid (FOLVITE) tablet 1 mg  1 mg Oral Daily Lorretta Harp, MD  1 mg at 01/24/17 0933  . furosemide (LASIX) tablet 80 mg  80 mg Oral BID Quintella Reichert, MD      . insulin aspart (novoLOG) injection 0-5 Units  0-5 Units Subcutaneous QHS Lorretta Harp, MD   2 Units at 01/20/17 2259  . insulin aspart (novoLOG) injection 0-9 Units  0-9 Units Subcutaneous TID WC Lorretta Harp, MD   2 Units at 01/24/17 1211  . metoprolol (LOPRESSOR) tablet 50 mg  50 mg Oral BID Hillis Range, MD   50 mg at 01/24/17 0932  . ondansetron (ZOFRAN) injection 4 mg  4 mg  Intravenous Q6H PRN Lorretta Harp, MD      . sodium chloride flush (NS) 0.9 % injection 3 mL  3 mL Intravenous Q12H Lorretta Harp, MD   3 mL at 01/24/17 4098  . sodium chloride flush (NS) 0.9 % injection 3 mL  3 mL Intravenous PRN Lorretta Harp, MD      . warfarin (COUMADIN) tablet 2 mg  2 mg Oral ONCE-1800 Jerald Kief, MD      . Warfarin - Pharmacist Dosing Inpatient   Does not apply [id number] Jerald Kief, MD         Discharge Medications: Please see discharge summary for a list of discharge medications.  Relevant Imaging Results:  Relevant Lab Results:   Additional Information SS# [id number]  Nelwyn Salisbury, LCSW

## 2017-01-24 NOTE — Clinical Social Work Note (Signed)
Clinical Social Work Assessment  Patient Details  Name: Bruce Price MRN: [id number] Date of Birth: 05/24/1941  Date of referral:  01/24/17               Reason for consult:  Facility Placement, Discharge Planning                Permission sought to share information with:  Family Supports Permission granted to share information::  Yes, Verbal Permission Granted  Name::     [place]::     Relationship::  spouse  Contact Information:  [phone]  [phone]   Housing/Transportation Living arrangements for the past 2 months:  [place] of Information:  Patient, Spouse Patient Interpreter Needed:  None Criminal Activity/Legal Involvement Pertinent to Current Situation/Hospitalization:  No - Comment as needed Significant Relationships:  Adult Children, Spouse Lives with:  Spouse Do you feel safe going back to the place where you live?  Yes Need for family participation in patient care:  Yes (Comment) (spouse involved in decision making)  Care giving concerns:   Pt from home where he resides with wife. Prior to hospitalization was requiring more assistance from wife with ambulating and bathing than at baseline. Eats independently. Uses rolling walker at home per wife. Wife report family feels his needs at this time are more than family can manage. States pt has been to rehab in past (2017) and did well.  Pt confirms information provided by wife.    Social Worker assessment / plan:  CSW consulted for potential SNF placement. Met with pt at bedside. Pt drowsy, responded to CSW but minimally interactive, advised to spoeak with wife re: SNF placement. CSW spoke with wife explaining role and reason for involvement. Wife familiar with referral/SNF process due to pt having been to SNF 2017. [facility]  Completed FL2. Referred to area SNFs, will follow up with pt/wife with bed offers.  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  Wife demonstrated understanding of plan and was hopeful pt would progress at rehab and be able to return home.   Emotional Assessment Appearance:  Appears stated age Attitude/Demeanor/Rapport:   (drowsy) Affect (typically observed):  Calm Orientation:  Oriented to Self, Oriented to Place Alcohol / Substance use:  Not Applicable Psych involvement (Current and /or in the community):  No (Comment)  Discharge Needs  Concerns to be addressed:  Discharge Planning Concerns Readmission within the last 30 days:  No Current discharge risk:  Dependent with Mobility Barriers to Discharge:  Continued Medical Work up   [employer], LCSW 01/24/2017, 12:23 PM  [phone]

## 2017-01-24 NOTE — Clinical Social Work Placement (Addendum)
   CLINICAL SOCIAL WORK PLACEMENT  NOTE  Date:  01/24/2017  Patient Details  Name: Bruce Price MRN: [id number] Date of Birth: August 23, 1941  Clinical Social Work is seeking post-discharge placement for this patient at the Skilled  Nursing Facility level of care (*CSW will initial, date and re-position this form in  chart as items are completed):  Yes   Patient/family provided with  [facility] Clinical Social Work Department's list of facilities offering this level of care within the geographic area requested by the patient (or if unable, by the patient's family).  Yes   Patient/family informed of their freedom to choose among providers that offer the needed level of care, that participate in Medicare, Medicaid or managed care program needed by the patient, have an available bed and are willing to accept the patient.  Yes   Patient/family informed of [facility]'s ownership interest in [facility] and [facility], as well as of the fact that they are under no obligation to receive care at these facilities.  PASRR submitted to EDS on       PASRR number received on       Existing PASRR number confirmed on 01/24/17     FL2 transmitted to all facilities in geographic area requested by pt/family on 01/24/17     FL2 transmitted to all facilities within larger geographic area on       Patient informed that his/her managed care company has contracts with or will negotiate with certain facilities, including the following:            Patient/family informed of bed offers received.  Patient chooses bed at     [facility]   Physician recommends and patient chooses bed at     [facility] Patient to be transferred to   [facility] on  .01/25/17  Patient to be transferred to facility by     [facility]  Patient family notified on  01/25/17 of transfer.  Name of family member notified:      Lenette, spouse  PHYSICIAN        Additional Comment:    _______________________________________________ Nelwyn Salisbury, LCSW 01/24/2017, 12:30 PM  [phone]

## 2017-01-24 NOTE — Progress Notes (Addendum)
[place] for warfarin>>Eliquis Indication: atrial fibrillation  No Known Allergies  Patient Measurements: Height: 6' (182.9 cm) Weight: 96 lb 4.8 oz (43.7 kg) IBW/kg (Calculated) : 77.6  Vital Signs: Temp: 97.9 F (36.6 C) (04/30 0652) Temp Source: Oral (04/30 0652) BP: 107/63 (04/30 5217) Pulse Rate: 60 (04/30 0652)  Labs:  Recent Labs  01/22/17 0543 01/23/17 0553 01/24/17 0632  LABPROT 33.7* 28.0* 24.5*  INR 3.23 2.56 2.17  CREATININE 1.73* 1.37* 1.37*    Estimated Creatinine Clearance: 27.9 mL/min (A) (by C-G formula based on SCr of 1.37 mg/dL (H)).  Assessment: 64 yoM with PMH AFib, CHF w/ AICD, HTN, HLD, DM, gout, chronic venous insufficiency of legs, who presents with SOB, LE edema, and oliguria. Initially presented to [facility] where INR was found to supratherapeutic. Patient later transferred to [facility] SDU for [place]. Pharmacy consulted to dose warfarin while admitted   Baseline INR elevated at 7.2  Prior anticoagulation: warfarin 5 mg daily, last dose 01/17/17  Significant events: 4/26 Left upper extremity doppler: new DVT  Today, 01/24/2017:   INR now in therapeutic range at 2.17.  Elevation felt likely d/t hepatic congestion from AECHF.  Cards suspects new DVT is probably secondary to pacemaker wire and elevation in INR is likely an acute event d/t nutritional status & pt may not have been at goal range before. Creat 1.37 x 2 days with good UOP.   Alk phos AST/ALT coming down but T bili up to 2.3  Last CBC on 4/26 stable  No bleeding documeted  Major drug interactions: none  On regular diet  Plan:  transition to Eliquis once INR< 2  Check am INR, if < 2 will start eliquis 5 mg po bid  Monitor for signs of bleeding or thrombosis  Eudelia Bunch, Pharm.D. [phone] 01/24/2017 10:52 AM

## 2017-01-24 NOTE — Care Management Important Message (Signed)
Important Message  Patient Details  Name: Bruce Price MRN: [id number] Date of Birth: [birth year]   Medicare Important Message Given:  Yes    Caren Macadam 01/24/2017, 12:05 PMImportant Message  Patient Details  Name: Bruce Price MRN: [id number] Date of Birth: May 16, 1941   Medicare Important Message Given:  Yes    Caren Macadam 01/24/2017, 12:05 PM

## 2017-01-24 NOTE — Discharge Instructions (Signed)

## 2017-01-25 DIAGNOSIS — I10 Essential (primary) hypertension: Secondary | ICD-10-CM | POA: Diagnosis not present

## 2017-01-25 DIAGNOSIS — Z950 Presence of cardiac pacemaker: Secondary | ICD-10-CM | POA: Diagnosis not present

## 2017-01-25 DIAGNOSIS — I82622 Acute embolism and thrombosis of deep veins of left upper extremity: Secondary | ICD-10-CM | POA: Diagnosis not present

## 2017-01-25 DIAGNOSIS — N170 Acute kidney failure with tubular necrosis: Secondary | ICD-10-CM | POA: Diagnosis not present

## 2017-01-25 DIAGNOSIS — R0602 Shortness of breath: Secondary | ICD-10-CM | POA: Diagnosis not present

## 2017-01-25 DIAGNOSIS — E1140 Type 2 diabetes mellitus with diabetic neuropathy, unspecified: Secondary | ICD-10-CM | POA: Diagnosis not present

## 2017-01-25 DIAGNOSIS — I472 Ventricular tachycardia: Secondary | ICD-10-CM | POA: Diagnosis not present

## 2017-01-25 DIAGNOSIS — Z7901 Long term (current) use of anticoagulants: Secondary | ICD-10-CM | POA: Diagnosis not present

## 2017-01-25 DIAGNOSIS — I4891 Unspecified atrial fibrillation: Secondary | ICD-10-CM | POA: Diagnosis not present

## 2017-01-25 DIAGNOSIS — I509 Heart failure, unspecified: Secondary | ICD-10-CM | POA: Diagnosis not present

## 2017-01-25 DIAGNOSIS — N189 Chronic kidney disease, unspecified: Secondary | ICD-10-CM | POA: Diagnosis not present

## 2017-01-25 DIAGNOSIS — I255 Ischemic cardiomyopathy: Secondary | ICD-10-CM | POA: Diagnosis not present

## 2017-01-25 DIAGNOSIS — R945 Abnormal results of liver function studies: Secondary | ICD-10-CM | POA: Diagnosis not present

## 2017-01-25 DIAGNOSIS — I517 Cardiomegaly: Secondary | ICD-10-CM | POA: Diagnosis not present

## 2017-01-25 DIAGNOSIS — I872 Venous insufficiency (chronic) (peripheral): Secondary | ICD-10-CM | POA: Diagnosis not present

## 2017-01-25 DIAGNOSIS — E1165 Type 2 diabetes mellitus with hyperglycemia: Secondary | ICD-10-CM | POA: Diagnosis not present

## 2017-01-25 DIAGNOSIS — I82629 Acute embolism and thrombosis of deep veins of unspecified upper extremity: Secondary | ICD-10-CM | POA: Diagnosis not present

## 2017-01-25 DIAGNOSIS — E1169 Type 2 diabetes mellitus with other specified complication: Secondary | ICD-10-CM | POA: Diagnosis not present

## 2017-01-25 DIAGNOSIS — M6281 Muscle weakness (generalized): Secondary | ICD-10-CM | POA: Diagnosis not present

## 2017-01-25 DIAGNOSIS — R570 Cardiogenic shock: Secondary | ICD-10-CM | POA: Diagnosis not present

## 2017-01-25 DIAGNOSIS — J989 Respiratory disorder, unspecified: Secondary | ICD-10-CM | POA: Diagnosis not present

## 2017-01-25 DIAGNOSIS — J9811 Atelectasis: Secondary | ICD-10-CM | POA: Diagnosis not present

## 2017-01-25 DIAGNOSIS — I87399 Chronic venous hypertension (idiopathic) with other complications of unspecified lower extremity: Secondary | ICD-10-CM | POA: Diagnosis not present

## 2017-01-25 DIAGNOSIS — I5023 Acute on chronic systolic (congestive) heart failure: Secondary | ICD-10-CM | POA: Diagnosis not present

## 2017-01-25 DIAGNOSIS — N183 Chronic kidney disease, stage 3 (moderate): Secondary | ICD-10-CM

## 2017-01-25 DIAGNOSIS — M159 Polyosteoarthritis, unspecified: Secondary | ICD-10-CM | POA: Diagnosis not present

## 2017-01-25 DIAGNOSIS — R531 Weakness: Secondary | ICD-10-CM | POA: Diagnosis not present

## 2017-01-25 DIAGNOSIS — Z95810 Presence of automatic (implantable) cardiac defibrillator: Secondary | ICD-10-CM | POA: Diagnosis not present

## 2017-01-25 DIAGNOSIS — J9600 Acute respiratory failure, unspecified whether with hypoxia or hypercapnia: Secondary | ICD-10-CM | POA: Diagnosis not present

## 2017-01-25 DIAGNOSIS — A419 Sepsis, unspecified organism: Secondary | ICD-10-CM | POA: Diagnosis not present

## 2017-01-25 DIAGNOSIS — I5022 Chronic systolic (congestive) heart failure: Secondary | ICD-10-CM | POA: Diagnosis not present

## 2017-01-25 DIAGNOSIS — E860 Dehydration: Secondary | ICD-10-CM | POA: Diagnosis not present

## 2017-01-25 DIAGNOSIS — R188 Other ascites: Secondary | ICD-10-CM | POA: Diagnosis not present

## 2017-01-25 DIAGNOSIS — I480 Paroxysmal atrial fibrillation: Secondary | ICD-10-CM | POA: Diagnosis not present

## 2017-01-25 DIAGNOSIS — I481 Persistent atrial fibrillation: Secondary | ICD-10-CM | POA: Diagnosis not present

## 2017-01-25 DIAGNOSIS — E785 Hyperlipidemia, unspecified: Secondary | ICD-10-CM | POA: Diagnosis not present

## 2017-01-25 DIAGNOSIS — I110 Hypertensive heart disease with heart failure: Secondary | ICD-10-CM | POA: Diagnosis not present

## 2017-01-25 DIAGNOSIS — R638 Other symptoms and signs concerning food and fluid intake: Secondary | ICD-10-CM | POA: Diagnosis not present

## 2017-01-25 DIAGNOSIS — E119 Type 2 diabetes mellitus without complications: Secondary | ICD-10-CM | POA: Diagnosis not present

## 2017-01-25 LAB — COMPREHENSIVE METABOLIC PANEL
ALK PHOS: 70 U/L (ref 38–126)
ALT: 87 U/L — AB (ref 17–63)
AST: 60 U/L — AB (ref 15–41)
Albumin: 2.8 g/dL — ABNORMAL LOW (ref 3.5–5.0)
Anion gap: 11 (ref 5–15)
BUN: 48 mg/dL — ABNORMAL HIGH (ref 6–20)
CALCIUM: 8.4 mg/dL — AB (ref 8.9–10.3)
CO2: 31 mmol/L (ref 22–32)
CREATININE: 1.2 mg/dL (ref 0.61–1.24)
Chloride: 94 mmol/L — ABNORMAL LOW (ref 101–111)
GFR calc non Af Amer: 57 mL/min — ABNORMAL LOW (ref >60)
Glucose, Bld: 122 mg/dL — ABNORMAL HIGH (ref 65–99)
Potassium: 3.9 mmol/L (ref 3.5–5.1)
SODIUM: 136 mmol/L (ref 135–145)
Total Bilirubin: 1.8 mg/dL — ABNORMAL HIGH (ref 0.3–1.2)
Total Protein: 6.4 g/dL — ABNORMAL LOW (ref 6.5–8.1)

## 2017-01-25 LAB — CBC
HCT: 36.7 % — ABNORMAL LOW (ref 39.0–52.0)
Hemoglobin: 12.4 g/dL — ABNORMAL LOW (ref 13.0–17.0)
MCH: 24.2 pg — AB (ref 26.0–34.0)
MCHC: 33.8 g/dL (ref 30.0–36.0)
MCV: 71.5 fL — AB (ref 78.0–100.0)
PLATELETS: 117 10*3/uL — AB (ref 150–400)
RBC: 5.13 MIL/uL (ref 4.22–5.81)
RDW: 16.8 % — AB (ref 11.5–15.5)
WBC: 6.4 10*3/uL (ref 4.0–10.5)

## 2017-01-25 LAB — GLUCOSE, CAPILLARY
GLUCOSE-CAPILLARY: 117 mg/dL — AB (ref 65–99)
Glucose-Capillary: 115 mg/dL — ABNORMAL HIGH (ref 65–99)
Glucose-Capillary: 134 mg/dL — ABNORMAL HIGH (ref 65–99)

## 2017-01-25 LAB — PROTIME-INR
INR: 2.01
PROTHROMBIN TIME: 23 s — AB (ref 11.4–15.2)

## 2017-01-25 MED ORDER — DOXAZOSIN MESYLATE 4 MG PO TABS: 4.0000 mg | ORAL_TABLET | Freq: Every day | ORAL | 0 refills | Status: AC

## 2017-01-25 MED ORDER — APIXABAN 5 MG PO TABS
5.0000 mg | ORAL_TABLET | Freq: Two times a day (BID) | ORAL | Status: DC
  Administered 2017-01-25: 5 mg via ORAL
  Filled 2017-01-25: qty 1

## 2017-01-25 MED ORDER — FUROSEMIDE 80 MG PO TABS: 80.0000 mg | ORAL_TABLET | Freq: Two times a day (BID) | ORAL | 0 refills | Status: AC

## 2017-01-25 MED ORDER — INSULIN DETEMIR 100 UNIT/ML ~~LOC~~ SOLN: 8.0000 [IU] | Freq: Every day | SUBCUTANEOUS | 0 refills | Status: DC

## 2017-01-25 MED ORDER — APIXABAN 5 MG PO TABS: 5.0000 mg | ORAL_TABLET | Freq: Two times a day (BID) | ORAL | 0 refills | Status: AC

## 2017-01-25 NOTE — Progress Notes (Signed)
ANTICOAGULATION CONSULT NOTE  Pharmacy Consult for warfarin>>Eliquis Indication: atrial fibrillation  No Known Allergies  Patient Measurements: Height: 6' (182.9 cm) Weight: 220 lb 7.4 oz (100 kg) IBW/kg (Calculated) : 77.6  Vital Signs: Temp: 98.4 F (36.9 C) (05/01 0612) Temp Source: Oral (05/01 0612) BP: 110/65 (05/01 0612) Pulse Rate: 75 (05/01 0936)  Labs:  Recent Labs  01/23/17 0553 01/24/17 0632 01/25/17 0416  HGB  --   --  12.4*  HCT  --   --  36.7*  PLT  --   --  117*  LABPROT 28.0* 24.5* 23.0*  INR 2.56 2.17 2.01  CREATININE 1.37* 1.37* 1.20    Estimated Creatinine Clearance: 63.1 mL/min (by C-G formula based on SCr of 1.2 mg/dL).  Assessment: 75 yoM with PMH AFib, CHF w/ AICD, HTN, HLD, DM, gout, chronic venous insufficiency of legs, who presents with SOB, LE edema, and oliguria. Initially presented to [place] [facility] where INR was found to supratherapeutic. Patient later transferred to [facility] SDU for AECHF. Pharmacy consulted to dose warfarin while admitted   Baseline INR elevated at 7.2  Prior anticoagulation: warfarin 5 mg daily, last dose 01/17/17  Significant events: 4/26 Left upper extremity doppler: new DVT  Today, 01/25/2017:   INR  2.01.  Prior elevation felt likely d/t hepatic congestion from AECHF.  Cards suspects new DVT is probably secondary to pacemaker wire and elevation in INR is likely an acute event d/t nutritional status & pt may not have been at goal range before. Creat down to 1.2  PLTC 237>>117,  Hg 13.5>12.4- discussed with Dr Chiu. Low platelet may be due to sepsis.  Alk phos AST/ALT coming down but T bili up to 2.3  No bleeding documeted  Major drug interactions: none  On regular diet  Plan:  will start eliquis 5 mg po bid  Monitor for signs of bleeding or thrombosis  Recheck CBC in one 1 week at SNF to f/u platelets  Larri Yehle T. [facility], Pharm.D. [phone] 01/25/2017 10:33 AM  

## 2017-01-25 NOTE — Progress Notes (Signed)
Attempted to call report 3 times to [facility] and [address]. Unable to get in touch with receiving RN.  Patient waiting for pickup by [facility].  Pick up time was scheduled for 5pm.  Have kept wife informed.  Wife requested we call her on this number [phone]) when transport arrives.  Will make night shift aware.

## 2017-01-25 NOTE — Progress Notes (Signed)
While writer was at lunch CCMD called and reported that patient had a 41 beat run of v-tach.  Pt asymptomatic.  Dr. Rhona Leavens made aware and page sent out to cardiology.

## 2017-01-25 NOTE — Progress Notes (Signed)
Called as patient had a 41 beat run of ventricular tachycardia.  Discussed case with Dr. Johney Frame, who saw patient over the weekend.  Prior to admission the patient was DNR/DNI and requested to continue those wishes so therefore not a candidate for AICD at this time.  His LFTs are still elevated and therefore would avoid use of Amio.  He is asymptomatic with his VT so no real indication for Amio.  This has not been shown to have any benefit in prolonging life in patients with VT and NIDCM.  Will continue BB but cannot increase at this time due to soft BP.  Needs early followup with is primary Cardiologist.

## 2017-01-25 NOTE — Progress Notes (Signed)
Progress Note  Patient Name: Bruce Price Date of Encounter: 01/25/2017  Primary Cardiologist: Dr. Curlene Labrum  Subjective   He denies any chest pain or SOB this am.   Inpatient Medications    Scheduled Meds: . apixaban  5 mg Oral BID  . doxazosin  4 mg Oral Daily  . folic acid  1 mg Oral Daily  . furosemide  80 mg Oral BID  . insulin aspart  0-5 Units Subcutaneous QHS  . insulin aspart  0-9 Units Subcutaneous TID WC  . metoprolol tartrate  50 mg Oral BID  . sodium chloride flush  3 mL Intravenous Q12H   Continuous Infusions: . sodium chloride     PRN Meds: sodium chloride, ondansetron (ZOFRAN) IV, sodium chloride flush   Vital Signs    Vitals:   01/24/17 2254 01/25/17 0556 01/25/17 0612 01/25/17 0936  BP: 109/71  110/65   Pulse: (!) 59  (!) 59 75  Resp:   20   Temp:   98.4 F (36.9 C)   TempSrc:  Oral Oral   SpO2:  99% 97%   Weight:   220 lb 7.4 oz (100 kg)   Height:        Intake/Output Summary (Last 24 hours) at 01/25/17 1137 Last data filed at 01/25/17 0933  Gross per 24 hour  Intake              460 ml  Output              850 ml  Net             -390 ml   Filed Weights   01/22/17 0532 01/24/17 1049 01/25/17 0612  Weight: 96 lb 4.8 oz (43.7 kg) 220 lb 3.8 oz (99.9 kg) 220 lb 7.4 oz (100 kg)     Physical Exam   General: WD, WN in NAD Head: benign Neck: supple with no bruit and no JVD Lungs: CTA bilaterally Heart: irregularly irregular with no M/R/G Abdomen: soft, NT, ND with active BS Extremities: no edemqa Neuro:A&O x 3 Psych: normal mood  Labs    Chemistry  Recent Labs Lab 01/23/17 0553 01/24/17 0632 01/25/17 0416  NA 139 134* 136  K 3.1* 4.2 3.9  CL 93* 90* 94*  CO2 35* 32 31  GLUCOSE 106* 167* 122*  BUN 50* 50* 48*  CREATININE 1.37* 1.37* 1.20  CALCIUM 8.3* 8.2* 8.4*  PROT 6.6 6.5 6.4*  ALBUMIN 2.9* 2.9* 2.8*  AST 118* 85* 60*  ALT 150* 115* 87*  ALKPHOS 83 73 70  BILITOT 2.1* 2.3* 1.8*  GFRNONAA 48* 48* 57*    GFRAA 56* 56* >60  ANIONGAP Hematology  Recent Labs Lab 01/20/17 0425 01/25/17 0416  WBC 13.2* 6.4  RBC 5.62 5.13  HGB 13.5 12.4*  HCT 40.7 36.7*  MCV 72.4* 71.5*  MCH 24.0* 24.2*  MCHC 33.2 33.8  RDW 16.6* 16.8*  PLT 237 117*    Cardiac Enzymes  Recent Labs Lab 01/19/17 0022 01/19/17 0655 01/19/17 1130  TROPONINI 0.06* 0.06* 0.06*   No results for input(s): TROPIPOC in the last 168 hours.   BNP  Recent Labs Lab 01/19/17 1130  BNP 3,881.3*     DDimer No results for input(s): DDIMER in the last 168 hours.   Radiology    No results found.   Telemetry    AV paced with frequent PVCs and nonsustained ventricular tachycardia - Personally Reviewed  ECG  No new EKG - Personally Reviewed  Cardiac Studies   Echocardiogram 01/20/17: Study Conclusions - Left ventricle: The cavity size was severely dilated. There was   moderate concentric hypertrophy. Systolic function was severely   reduced. The estimated ejection fraction was in the range of 25%   to 30%. Severe diffuse hypokinesis with no identifiable regional   variations. There was a reduced contribution of atrial   contraction to ventricular filling, due to increased ventricular   diastolic pressure or atrial contractile dysfunction. Doppler   parameters are consistent with a reversible restrictive pattern,   indicative of decreased left ventricular diastolic compliance   and/or increased left atrial pressure (grade 3 diastolic   dysfunction). Doppler parameters are consistent with high   ventricular filling pressure. - Mitral valve: Calcified annulus. There was moderate   regurgitation. - Left atrium: The atrium was severely dilated. - Right ventricle: The cavity size was severely dilated. Wall   thickness was normal. - Right atrium: The atrium was severely dilated. - Tricuspid valve: There was moderate regurgitation. - Pulmonary arteries: PA peak pressure: 52 mm Hg (S). -  Pericardium, extracardiac: A trivial pericardial effusion was   identified posterior to the heart.  Impressions: - The right ventricular systolic pressure was increased consistent   with moderate pulmonary hypertension.  Patient Profile    76 y.o. male history of NICM and PAF on Coumadin. He had a cath in 2016- 30% mLAD 25%. He then had a BiV pacemaker implanted in Aug 2017 after he presented to [facility] with bradycardia. Other medical history includes chronic venous edema and venous ulcer.  He was admitted to [facility] 01/18/17 as a transfer from [facility] in acute respiratory distress after he presented there with SOB. He was found to have a BNP of 5150, elevated LFTs, an INR of 8, K+ 5.4, and a SCr of 3.0 (up from 1.7). He is a DNR and aggressive measures (IV inotropics) are not felt to be appropriate.  Assessment & Plan     1. Acute on chronic systolic and diastolic heart failure, NICM - overall net negative 11.6L, continue strict I&Os; admission weight was 222 lbs and today is 220lbs. - continue PO Lasix - sCr improved at 1.2 (1.37), K WNL - telemetry review pacing in the 60s with frequent PVCs - Pt is DNR, medical management only - continue lopressor, runs of Vtach have decreased but still present.   - cannot increase BB further due to soft BP - he is DNR, felt not to be a candidate for aggressive measures such as inotropic therapy or mechanical therapy per AHF team and therefore would not consider him a candidate for upgrade of his BiV pacer to AICD for primary prevention since he is DNR.  2. PAF with RVR - currently pacing with runs of Vtach - continue lopressor  - converted from coumadin to Eliquis today  3. CAD - 30% mLAD at cath Oct 2016 (Dr Dot Been in [facility]) after an abnormal stress  - medical management  4. Acute on chronic renal insufficiency - sCr stable 1.2 with good urine output - baseline sCr difficult to determine  5.  Chronic anticoagulation - CHA2Ds2 VASc=5, per Dr  Concha Norway. Pt is on Coumadin with supra therapeutic INR on admission- 7.22.  INR improving: 2.17 (2.56) today - transitioning to Eliquis per [facility]  No other recs at this time.  Will sign off.  Please have him followup with his Cardiologist in [place].   Signed, Armanda Magic , PA-C 11:37 AM  01/25/2017 Pager: [phone]

## 2017-01-25 NOTE — Discharge Summary (Addendum)
Physician Discharge Summary  Bruce Price:[id number] DOB: 1941/05/30 DOA: 01/18/2017  PCP: Eloisa Northern, MD  Admit date: 01/18/2017 Discharge date: 01/25/2017  Admitted From: Home Disposition:  SNF  Recommendations for Outpatient Follow-up:  1. Follow up with PCP in 1-2 weeks 2. Follow up with Cardiology as scheduled 3. Please monitor glucose before meals and at bedtime. Patient has required only minimal amounts of sliding scale coverage while admitted. If glucose becomes poorly controlled, consider starting low dose Levemir and titrate accordingly  Discharge Condition:Improved CODE STATUS:DNR Diet recommendation: Heart healthy, diabetic   Brief/Interim Summary: 76 y.o.malewith medical history significant of sCHF (per EDP, Dr. Drue Second, EF is 25%, no 2d ehco available in Epic), s/p of AICD, hypertension, hyperlipidemia, diabetes mellitus, gout, chronic venous insufficiency in legs, who presents with shortness of breath, leg edema, orthopnea, decreased urine output.  Pt is transferred from [facility] per EDP, Dr. Drue Second.  Pt states that he has been having shortness of breath, leg edema, orthopnea, decreased urine output for several days, which has been progressively getting worse today. Patient has dry cough, no chest pain, fever or chills. Has nausea, no vomiting, diarrhea, abdominal pain or symptoms of UTI. No unilateral weakness, slurred speech or facial droop.No hematuria, hematemesis or hematochezia.  Patient was initially evaluated in [facility]. He was found to have BNP 51400, troponin 0.10, digoix 2.1 (up normal limit is 2.0), WBC 9.8, hemoglobin 14.8, INR 8.0, lactic acid 7.1, negative urinalysis, potassium 5.4, creatinine is up from 1.7 to 3.0 per EDP, bicarbonate 18, AG 30, abnormal liver functions with ALP and 22, AST 191, ALT 113, total bilirubin 2.3, temperature normal, tachycardia with heart rate 112, respiration rate 18, oxygen saturation 95% on room air,  blood pressure 176/69. Pt received 600 cc of NS in ED, his lactic acid level decreased from 7.1-->5.0.  Acute respiratory distress due to acute on chronic systolic CHF: - Patient presented with elevated BNP 51400, bilateral leg edema and positive JVD, consistent with CHF exacerbation. Pt reported to reported to have EF is 25%. - Cardiology consulted. Appreciate in put. Recommendation to increase dose of lasix -Initially required scheduled IV lasix - Metoprolol 25mg  bid was continued per Cardiology - Patient now on minimal O2 support and transitioned to PO lasix  Atrial Fibrillation and Coagulopathy due to coumadin: CHA2DS2-VASc Scoreis 5, needs oral anticoagulation. Patient is on Coumadin at home. INR is 8.0 in ED. Heart rate is ~110s. Nobleeding tendency. - INR down to 2.0 -continue beta blocker as above -holding digoxin for now -Renal function has stabilized. Discussed with pharmacist. Will start Eliquis. - Patient to follow up with Dr. Tomie China as scheduled - Plts of 117 at time of discharge. Recommend repeat CBC in 1 week post-discharge  HTN:  -Have continued metoprolol and doxazosin per above -Plan to continue with metoprolol per above -Remained stable  HLD: - Last LDL was not on record -Held zocor due to abnormal liver function  DM-II: - Last A1c not on record. Pt used to be on levemir, but currently patient is not taking meds at home (no DM meds in his med list from [facility]). -Have continued SSI coverage while admitted  Elevated lactic acid level: - Patient without signs of infection.  - Suspect lactic acidosis secondary to tissue hypoxia  - Presently stable  Acute on chronic CKD 3:  - Outpt labs reviewed. Baseline Cr of around 1-1.5 - Given concerns of volume overload, have continued aggressive diuresis. -Cr has improved to 1.20 on day of  discharge  Abnormal LFTs: abnormal liver functions with ALP and 22, AST 191, ALT 113, total bilirubin 2.3. US-RUQ  showed gallstone 2.5 cm, gallbladder wall thickening 26.5, this could be from hypoproteinemia per radiologist. Pt does not have fever or leukocytosis or AP. Remained otherwise asymptomatic, tolerating diet -Possible hepatic congestion secondary to acute heart failure -Continuing to hold Zocor - LFT's have improved with diuresis  Chronic venous insufficiency and skin lesions in legs. -Have consulted to wound care with recommendations noted  Left anterior calf, Right anterior leg with healing full thickness wounds  Hyperkalemia: No EKG change. -Patient did not tolerate kayexelate enema - Currently resolved with diuresis  LUE DVT with arm swelling  - LUE DVT noted on UE doppler - Will cont with coumadin per pharmacy dosing - Suspect related to pacer wires - VQ w/ very low likelihood of PE - Stable. Advised family not to "massage" arm, as suggested by pt's wife  Non-sustained VT - Discussed case with Cardiology - Pacemaker in place - Plan to continue beta blocker as ordered - Per Cardiology, pt to follow up with primary cardiologist, Dr. Tomie China  Discharge Diagnoses:  Principal Problem:   Cardiogenic shock (HCC) Active Problems:   Essential hypertension   High cholesterol   Diabetes mellitus (HCC)   Atrial fibrillation (HCC)   Systolic CHF (HCC)   Acute on chronic systolic CHF (congestive heart failure) (HCC)   Elevated lactic acid level   Acute on chronic kidney failure (HCC)   Coagulopathy (HCC)   Abnormal LFTs   Acute respiratory distress   Chronic venous insufficiency   Presence of biventricular cardiac pacemaker   CHF exacerbation (HCC)   Cardiorenal syndrome with renal failure   Pressure injury of skin   Swelling of left upper extremity    Discharge Instructions   Allergies as of 01/25/2017   No Known Allergies     Medication List    STOP taking these medications   benazepril 5 MG tablet Commonly known as:  LOTENSIN   digoxin 0.125 MG  tablet Commonly known as:  LANOXIN   warfarin 5 MG tablet Commonly known as:  COUMADIN     TAKE these medications   apixaban 5 MG Tabs tablet Commonly known as:  ELIQUIS Take 1 tablet (5 mg total) by mouth 2 (two) times daily.   doxazosin 4 MG tablet Commonly known as:  CARDURA Take 1 tablet (4 mg total) by mouth daily. Start taking on:  01/26/2017   fish oil-omega-3 fatty acids 1000 MG capsule Take 1 g by mouth daily.   folic acid 1 MG tablet Commonly known as:  FOLVITE Take 1 mg by mouth daily.   furosemide 80 MG tablet Commonly known as:  LASIX Take 1 tablet (80 mg total) by mouth 2 (two) times daily. What changed:  medication strength  how much to take  when to take this  additional instructions   metoprolol tartrate 25 MG tablet Commonly known as:  LOPRESSOR Take 50 mg by mouth 2 (two) times daily.   simvastatin 80 MG tablet Commonly known as:  ZOCOR Take 80 mg by mouth daily.       Contact information for follow-up providers    [facility] R, MD. Schedule an appointment as soon as possible for a visit in 1 week(s).   Specialty:  Cardiology Contact information: [address] [place] [address] [phone]            Contact information for after-discharge care    Destination  [facility] Follow up.   Specialty:  Skilled Nursing Facility Contact information: [address] [place] [address] [phone]                 No Known Allergies  Consultations:  Cardiology  Procedures/Studies: Dg Chest 2 View  Result Date: 01/21/2017 CLINICAL DATA:  Shortness of breath, left-sided weakness, hypertension, diabetes EXAM: CHEST  2 VIEW COMPARISON:  01/19/2017 FINDINGS: Marked cardiomegaly with vascular and interstitial prominence compatible with mild edema. Small pleural effusions present bilaterally with basilar atelectasis. Upper lobes remain clear. No pneumothorax. Trachea is  midline. Aorta is atherosclerotic. Left subclavian pacemaker evident. Degenerative changes of the spine and shoulders. IMPRESSION: Cardiomegaly with mild interstitial edema pattern, bibasilar atelectasis, and small effusions compatible with CHF Thoracic aortic atherosclerosis Electronically Signed   By: Judie Petit.  Shick M.D.   On: 01/21/2017 13:11   Nm Pulmonary Perf And Vent  Result Date: 01/21/2017 CLINICAL DATA:  Shortness of breath, known DVT question pulmonary embolism, renal insufficiency EXAM: NUCLEAR MEDICINE VENTILATION - PERFUSION LUNG SCAN TECHNIQUE: Ventilation images were obtained in multiple projections using inhaled aerosol Tc-31m DTPA. Perfusion images were obtained in multiple projections after intravenous injection of Tc-14m MAA. RADIOPHARMACEUTICALS:  32.8 mCi Technetium-51m DTPA aerosol inhalation and 4.0 mCi Technetium-47m MAA IV COMPARISON:  None Correlation: Chest radiograph 01/21/2017 FINDINGS: Ventilation: Scattered central airway deposition of aerosol. Diminished ventilation RIGHT upper lobe and both lower lobes. Subsegmental ventilation defect RIGHT lower lobe. Enlargement of cardiac silhouette. Perfusion: Enlargement of cardiac silhouette. Single subsegmental perfusion defect RIGHT lower lobe matching ventilatory finding. No additional perfusion defects seen. Chest radiograph: Enlargement of cardiac silhouette post pacemaker. RIGHT basilar atelectasis. IMPRESSION: Very low probability for pulmonary embolism. Electronically Signed   By: Ulyses Southward M.D.   On: 01/21/2017 13:01   Dg Chest Port 1 View  Result Date: 01/19/2017 CLINICAL DATA:  Congestive heart failure. EXAM: PORTABLE CHEST 1 VIEW COMPARISON:  Radiographs of January 18, 2017. FINDINGS: Stable cardiomegaly. Atherosclerosis of thoracic aorta is noted. Left-sided pacemaker is unchanged in position. Mild central pulmonary vascular congestion is noted. Minimal right pleural effusion is noted along right lateral chest wall.  Degenerative changes seen involving both acromioclavicular joints. Left lung is clear. Mild right basilar subsegmental atelectasis is noted. IMPRESSION: Aortic atherosclerosis. Stable cardiomegaly with central pulmonary vascular congestion. Minimal right pleural effusion is noted. Mild right basilar subsegmental atelectasis. Electronically Signed   By: Lupita Raider, M.D.   On: 01/19/2017 09:37    Subjective: Eager to go to rehab  Discharge Exam: Vitals:   01/25/17 0612 01/25/17 0936  BP: 110/65   Pulse: (!) 59 75  Resp: 20   Temp: 98.4 F (36.9 C)    Vitals:   01/24/17 2254 01/25/17 0556 01/25/17 0612 01/25/17 0936  BP: 109/71  110/65   Pulse: (!) 59  (!) 59 75  Resp:   20   Temp:   98.4 F (36.9 C)   TempSrc:  Oral Oral   SpO2:  99% 97%   Weight:   100 kg (220 lb 7.4 oz)   Height:        General: Pt is alert, awake, not in acute distress Cardiovascular: RRR, S1/S2 +, no rubs, no gallops Respiratory: CTA bilaterally, no wheezing, no rhonchi Abdominal: Soft, NT, ND, bowel sounds + Extremities: no edema, no cyanosis   The results of significant diagnostics from this hospitalization (including imaging, microbiology, ancillary and laboratory) are listed below for reference.  Microbiology: Recent Results (from the past 240 hour(s))  MRSA PCR Screening     Status: None   Collection Time: 01/18/17  9:47 PM  Result Value Ref Range Status   MRSA by PCR NEGATIVE NEGATIVE Final    Comment:        The GeneXpert MRSA Assay (FDA approved for NASAL specimens only), is one component of a comprehensive MRSA colonization surveillance program. It is not intended to diagnose MRSA infection nor to guide or monitor treatment for MRSA infections.      Labs: BNP (last 3 results)  Recent Labs  01/19/17 1130  BNP 3,881.3*   Basic Metabolic Panel:  Recent Labs Lab 01/20/17 0425 01/21/17 0551 01/22/17 0543 01/23/17 0553 01/24/17 0632 01/25/17 0416  NA 135 140 136  139 134* 136  K 5.3* 3.8 3.7 3.1* 4.2 3.9  CL 96* 97* 93* 93* 90* 94*  CO2 27 30 32 35* 32 31  GLUCOSE 107* 132* 167* 106* 167* 122*  BUN 70* 70* 57* 50* 50* 48*  CREATININE 3.04* 2.32* 1.73* 1.37* 1.37* 1.20  CALCIUM 8.2* 8.3* 8.3* 8.3* 8.2* 8.4*  MG 2.3  --   --  1.9  --   --    Liver Function Tests:  Recent Labs Lab 01/22/17 0543 01/23/17 0553 01/24/17 0632 01/25/17 0416  AST 173* 118* 85* 60*  ALT 189* 150* 115* 87*  ALKPHOS 78 83 73 70  BILITOT 2.0* 2.1* 2.3* 1.8*  PROT 6.3* 6.6 6.5 6.4*  ALBUMIN 2.9* 2.9* 2.9* 2.8*   No results for input(s): LIPASE, AMYLASE in the last 168 hours. No results for input(s): AMMONIA in the last 168 hours. CBC:  Recent Labs Lab 01/20/17 0425 01/25/17 0416  WBC 13.2* 6.4  HGB 13.5 12.4*  HCT 40.7 36.7*  MCV 72.4* 71.5*  PLT 237 117*   Cardiac Enzymes:  Recent Labs Lab 01/19/17 0022 01/19/17 0655 01/19/17 1130  TROPONINI 0.06* 0.06* 0.06*   BNP: Invalid input(s): POCBNP CBG:  Recent Labs Lab 01/23/17 2310 01/24/17 0725 01/24/17 1206 01/24/17 1658 01/25/17 0732  GLUCAP 179* 146* 193* 225* 117*   D-Dimer No results for input(s): DDIMER in the last 72 hours. Hgb A1c No results for input(s): HGBA1C in the last 72 hours. Lipid Profile No results for input(s): CHOL, HDL, LDLCALC, TRIG, CHOLHDL, LDLDIRECT in the last 72 hours. Thyroid function studies No results for input(s): TSH, T4TOTAL, T3FREE, THYROIDAB in the last 72 hours.  Invalid input(s): FREET3 Anemia work up No results for input(s): VITAMINB12, FOLATE, FERRITIN, TIBC, IRON, RETICCTPCT in the last 72 hours. Urinalysis No results found for: COLORURINE, APPEARANCEUR, LABSPEC, PHURINE, GLUCOSEU, HGBUR, BILIRUBINUR, KETONESUR, PROTEINUR, UROBILINOGEN, NITRITE, LEUKOCYTESUR Sepsis Labs Invalid input(s): PROCALCITONIN,  WBC,  LACTICIDVEN Microbiology Recent Results (from the past 240 hour(s))  MRSA PCR Screening     Status: None   Collection Time: 01/18/17   9:47 PM  Result Value Ref Range Status   MRSA by PCR NEGATIVE NEGATIVE Final    Comment:        The GeneXpert MRSA Assay (FDA approved for NASAL specimens only), is one component of a comprehensive MRSA colonization surveillance program. It is not intended to diagnose MRSA infection nor to guide or monitor treatment for MRSA infections.      SIGNED:   Jerald Kief, MD  [facility] 01/25/2017, 12:12 PM  If 7PM-7AM, please contact night-coverage [email or web address] Password [email or web address]

## 2017-01-25 NOTE — Progress Notes (Signed)
LCSW following for discharge planning/disposition:  PT discharging today to [facility].  Patient will transport by Sharin Mons- CSW completed medical necessity form and called for transportation. Expected at 14:00 Family notified regarding discharge - spouse Lenette at bedside, agreeable to plan. All information sent to facility by [facility] Report number for RN:  [phone]  No other needs at this time   Plan: DC to [facility].  Ilean Skill, MSW, LCSW Clinical Social Work 01/25/2017 [phone]

## 2017-01-26 DIAGNOSIS — I5022 Chronic systolic (congestive) heart failure: Secondary | ICD-10-CM | POA: Diagnosis not present

## 2017-01-26 DIAGNOSIS — I10 Essential (primary) hypertension: Secondary | ICD-10-CM | POA: Diagnosis not present

## 2017-01-26 DIAGNOSIS — M6281 Muscle weakness (generalized): Secondary | ICD-10-CM | POA: Diagnosis not present

## 2017-01-26 DIAGNOSIS — I4891 Unspecified atrial fibrillation: Secondary | ICD-10-CM | POA: Diagnosis not present

## 2017-01-29 DIAGNOSIS — M159 Polyosteoarthritis, unspecified: Secondary | ICD-10-CM | POA: Diagnosis not present

## 2017-01-29 DIAGNOSIS — I10 Essential (primary) hypertension: Secondary | ICD-10-CM | POA: Diagnosis not present

## 2017-01-29 DIAGNOSIS — I4891 Unspecified atrial fibrillation: Secondary | ICD-10-CM | POA: Diagnosis not present

## 2017-01-29 DIAGNOSIS — E1140 Type 2 diabetes mellitus with diabetic neuropathy, unspecified: Secondary | ICD-10-CM | POA: Diagnosis not present

## 2017-01-31 DIAGNOSIS — I4891 Unspecified atrial fibrillation: Secondary | ICD-10-CM | POA: Diagnosis not present

## 2017-01-31 DIAGNOSIS — I5022 Chronic systolic (congestive) heart failure: Secondary | ICD-10-CM | POA: Diagnosis not present

## 2017-01-31 DIAGNOSIS — I872 Venous insufficiency (chronic) (peripheral): Secondary | ICD-10-CM | POA: Diagnosis not present

## 2017-02-01 ENCOUNTER — Other Ambulatory Visit: Payer: Self-pay | Admitting: *Deleted

## 2017-02-01 DIAGNOSIS — E1165 Type 2 diabetes mellitus with hyperglycemia: Secondary | ICD-10-CM | POA: Diagnosis not present

## 2017-02-01 DIAGNOSIS — I10 Essential (primary) hypertension: Secondary | ICD-10-CM | POA: Diagnosis not present

## 2017-02-01 DIAGNOSIS — I5022 Chronic systolic (congestive) heart failure: Secondary | ICD-10-CM | POA: Diagnosis not present

## 2017-02-01 DIAGNOSIS — I4891 Unspecified atrial fibrillation: Secondary | ICD-10-CM | POA: Diagnosis not present

## 2017-02-01 NOTE — Patient Outreach (Signed)
[facility]) Care Management  02/01/2017  Bruce Price 1940-10-29 [id number]   Met with Bruce Price, case manager at facility to review patient case. She reports patient plan is to return home, he is [facility].   Patient has had one admission, chronic risk score is 8. He does qualify for [facility] care management services, but no needs at this time.   Patient will get EMMI calls upon discharge.  Plan to sign off. Requested that CM let RNCM know of any changes to discharge planning needs she agrees.  Bruce Crochet. [name], RN, BSN, [place] [phone]) Business Cell  [phone]) Toll Free Office

## 2017-02-06 DIAGNOSIS — A419 Sepsis, unspecified organism: Secondary | ICD-10-CM

## 2017-02-06 DIAGNOSIS — E785 Hyperlipidemia, unspecified: Secondary | ICD-10-CM

## 2017-02-06 DIAGNOSIS — I110 Hypertensive heart disease with heart failure: Secondary | ICD-10-CM | POA: Diagnosis not present

## 2017-02-06 DIAGNOSIS — I480 Paroxysmal atrial fibrillation: Secondary | ICD-10-CM | POA: Diagnosis not present

## 2017-02-06 DIAGNOSIS — I509 Heart failure, unspecified: Secondary | ICD-10-CM | POA: Diagnosis not present

## 2017-02-06 DIAGNOSIS — I255 Ischemic cardiomyopathy: Secondary | ICD-10-CM

## 2017-02-06 DIAGNOSIS — Z95810 Presence of automatic (implantable) cardiac defibrillator: Secondary | ICD-10-CM

## 2017-02-06 DIAGNOSIS — R0602 Shortness of breath: Secondary | ICD-10-CM | POA: Diagnosis not present

## 2017-02-06 DIAGNOSIS — R531 Weakness: Secondary | ICD-10-CM | POA: Diagnosis not present

## 2017-02-06 DIAGNOSIS — Z7901 Long term (current) use of anticoagulants: Secondary | ICD-10-CM

## 2017-02-06 DIAGNOSIS — E1169 Type 2 diabetes mellitus with other specified complication: Secondary | ICD-10-CM

## 2017-02-06 DIAGNOSIS — R188 Other ascites: Secondary | ICD-10-CM | POA: Diagnosis not present

## 2017-02-06 DIAGNOSIS — I82629 Acute embolism and thrombosis of deep veins of unspecified upper extremity: Secondary | ICD-10-CM

## 2017-02-06 DIAGNOSIS — I10 Essential (primary) hypertension: Secondary | ICD-10-CM

## 2017-02-06 DIAGNOSIS — R638 Other symptoms and signs concerning food and fluid intake: Secondary | ICD-10-CM

## 2017-02-06 DIAGNOSIS — E860 Dehydration: Secondary | ICD-10-CM

## 2017-02-06 DIAGNOSIS — N183 Chronic kidney disease, stage 3 (moderate): Secondary | ICD-10-CM

## 2017-02-07 DIAGNOSIS — Z950 Presence of cardiac pacemaker: Secondary | ICD-10-CM | POA: Diagnosis not present

## 2017-02-07 DIAGNOSIS — I509 Heart failure, unspecified: Secondary | ICD-10-CM | POA: Diagnosis not present

## 2017-02-07 DIAGNOSIS — I472 Ventricular tachycardia: Secondary | ICD-10-CM | POA: Diagnosis not present

## 2017-02-07 DIAGNOSIS — I82629 Acute embolism and thrombosis of deep veins of unspecified upper extremity: Secondary | ICD-10-CM | POA: Diagnosis not present

## 2017-02-07 DIAGNOSIS — E872 Acidosis: Secondary | ICD-10-CM | POA: Diagnosis present

## 2017-02-07 DIAGNOSIS — N183 Chronic kidney disease, stage 3 (moderate): Secondary | ICD-10-CM | POA: Diagnosis not present

## 2017-02-07 DIAGNOSIS — R2681 Unsteadiness on feet: Secondary | ICD-10-CM | POA: Diagnosis not present

## 2017-02-07 DIAGNOSIS — R031 Nonspecific low blood-pressure reading: Secondary | ICD-10-CM | POA: Diagnosis not present

## 2017-02-07 DIAGNOSIS — I4891 Unspecified atrial fibrillation: Secondary | ICD-10-CM | POA: Diagnosis not present

## 2017-02-07 DIAGNOSIS — I5023 Acute on chronic systolic (congestive) heart failure: Secondary | ICD-10-CM | POA: Diagnosis present

## 2017-02-07 DIAGNOSIS — I87399 Chronic venous hypertension (idiopathic) with other complications of unspecified lower extremity: Secondary | ICD-10-CM | POA: Diagnosis not present

## 2017-02-07 DIAGNOSIS — E785 Hyperlipidemia, unspecified: Secondary | ICD-10-CM | POA: Diagnosis not present

## 2017-02-07 DIAGNOSIS — I959 Hypotension, unspecified: Secondary | ICD-10-CM | POA: Diagnosis not present

## 2017-02-07 DIAGNOSIS — A419 Sepsis, unspecified organism: Secondary | ICD-10-CM | POA: Diagnosis not present

## 2017-02-07 DIAGNOSIS — I482 Chronic atrial fibrillation: Secondary | ICD-10-CM | POA: Diagnosis present

## 2017-02-07 DIAGNOSIS — I255 Ischemic cardiomyopathy: Secondary | ICD-10-CM | POA: Diagnosis not present

## 2017-02-07 DIAGNOSIS — R680 Hypothermia, not associated with low environmental temperature: Secondary | ICD-10-CM | POA: Diagnosis present

## 2017-02-07 DIAGNOSIS — E119 Type 2 diabetes mellitus without complications: Secondary | ICD-10-CM | POA: Diagnosis not present

## 2017-02-07 DIAGNOSIS — Z7901 Long term (current) use of anticoagulants: Secondary | ICD-10-CM | POA: Diagnosis not present

## 2017-02-07 DIAGNOSIS — M6281 Muscle weakness (generalized): Secondary | ICD-10-CM | POA: Diagnosis not present

## 2017-02-07 DIAGNOSIS — Z8669 Personal history of other diseases of the nervous system and sense organs: Secondary | ICD-10-CM | POA: Diagnosis not present

## 2017-02-07 DIAGNOSIS — R55 Syncope and collapse: Secondary | ICD-10-CM | POA: Diagnosis not present

## 2017-02-07 DIAGNOSIS — E1169 Type 2 diabetes mellitus with other specified complication: Secondary | ICD-10-CM | POA: Diagnosis not present

## 2017-02-07 DIAGNOSIS — J9600 Acute respiratory failure, unspecified whether with hypoxia or hypercapnia: Secondary | ICD-10-CM | POA: Diagnosis not present

## 2017-02-07 DIAGNOSIS — R638 Other symptoms and signs concerning food and fluid intake: Secondary | ICD-10-CM | POA: Diagnosis not present

## 2017-02-07 DIAGNOSIS — R188 Other ascites: Secondary | ICD-10-CM | POA: Diagnosis not present

## 2017-02-07 DIAGNOSIS — Z7984 Long term (current) use of oral hypoglycemic drugs: Secondary | ICD-10-CM | POA: Diagnosis not present

## 2017-02-07 DIAGNOSIS — R5381 Other malaise: Secondary | ICD-10-CM | POA: Diagnosis present

## 2017-02-07 DIAGNOSIS — R489 Unspecified symbolic dysfunctions: Secondary | ICD-10-CM | POA: Diagnosis not present

## 2017-02-07 DIAGNOSIS — I82622 Acute embolism and thrombosis of deep veins of left upper extremity: Secondary | ICD-10-CM | POA: Diagnosis not present

## 2017-02-07 DIAGNOSIS — Z794 Long term (current) use of insulin: Secondary | ICD-10-CM | POA: Diagnosis not present

## 2017-02-07 DIAGNOSIS — R531 Weakness: Secondary | ICD-10-CM | POA: Diagnosis not present

## 2017-02-07 DIAGNOSIS — I130 Hypertensive heart and chronic kidney disease with heart failure and stage 1 through stage 4 chronic kidney disease, or unspecified chronic kidney disease: Secondary | ICD-10-CM | POA: Diagnosis present

## 2017-02-07 DIAGNOSIS — E1122 Type 2 diabetes mellitus with diabetic chronic kidney disease: Secondary | ICD-10-CM | POA: Diagnosis present

## 2017-02-07 DIAGNOSIS — Z86718 Personal history of other venous thrombosis and embolism: Secondary | ICD-10-CM | POA: Diagnosis not present

## 2017-02-07 DIAGNOSIS — R4020 Unspecified coma: Secondary | ICD-10-CM | POA: Diagnosis not present

## 2017-02-07 DIAGNOSIS — R9431 Abnormal electrocardiogram [ECG] [EKG]: Secondary | ICD-10-CM | POA: Diagnosis not present

## 2017-02-07 DIAGNOSIS — R000 Tachycardia, unspecified: Secondary | ICD-10-CM | POA: Diagnosis present

## 2017-02-07 DIAGNOSIS — Z79899 Other long term (current) drug therapy: Secondary | ICD-10-CM | POA: Diagnosis not present

## 2017-02-07 DIAGNOSIS — I10 Essential (primary) hypertension: Secondary | ICD-10-CM | POA: Diagnosis not present

## 2017-02-07 DIAGNOSIS — R262 Difficulty in walking, not elsewhere classified: Secondary | ICD-10-CM | POA: Diagnosis not present

## 2017-02-07 DIAGNOSIS — I480 Paroxysmal atrial fibrillation: Secondary | ICD-10-CM | POA: Diagnosis present

## 2017-02-07 DIAGNOSIS — N189 Chronic kidney disease, unspecified: Secondary | ICD-10-CM | POA: Diagnosis not present

## 2017-02-07 DIAGNOSIS — E669 Obesity, unspecified: Secondary | ICD-10-CM | POA: Diagnosis present

## 2017-02-07 DIAGNOSIS — I110 Hypertensive heart disease with heart failure: Secondary | ICD-10-CM | POA: Diagnosis not present

## 2017-02-07 DIAGNOSIS — I872 Venous insufficiency (chronic) (peripheral): Secondary | ICD-10-CM | POA: Diagnosis not present

## 2017-02-07 DIAGNOSIS — Z95810 Presence of automatic (implantable) cardiac defibrillator: Secondary | ICD-10-CM | POA: Diagnosis not present

## 2017-02-07 DIAGNOSIS — R6520 Severe sepsis without septic shock: Secondary | ICD-10-CM | POA: Diagnosis not present

## 2017-02-07 DIAGNOSIS — R0602 Shortness of breath: Secondary | ICD-10-CM | POA: Diagnosis not present

## 2017-02-07 DIAGNOSIS — Z66 Do not resuscitate: Secondary | ICD-10-CM | POA: Diagnosis present

## 2017-02-07 DIAGNOSIS — E860 Dehydration: Secondary | ICD-10-CM | POA: Diagnosis not present

## 2017-02-07 DIAGNOSIS — E7800 Pure hypercholesterolemia, unspecified: Secondary | ICD-10-CM | POA: Diagnosis not present

## 2017-02-09 DIAGNOSIS — Z79899 Other long term (current) drug therapy: Secondary | ICD-10-CM | POA: Diagnosis not present

## 2017-02-09 DIAGNOSIS — I472 Ventricular tachycardia: Secondary | ICD-10-CM | POA: Diagnosis not present

## 2017-02-09 DIAGNOSIS — I110 Hypertensive heart disease with heart failure: Secondary | ICD-10-CM | POA: Diagnosis not present

## 2017-02-09 DIAGNOSIS — I4891 Unspecified atrial fibrillation: Secondary | ICD-10-CM | POA: Diagnosis not present

## 2017-02-09 DIAGNOSIS — I5023 Acute on chronic systolic (congestive) heart failure: Secondary | ICD-10-CM | POA: Diagnosis not present

## 2017-02-09 DIAGNOSIS — I255 Ischemic cardiomyopathy: Secondary | ICD-10-CM | POA: Diagnosis not present

## 2017-02-09 DIAGNOSIS — R031 Nonspecific low blood-pressure reading: Secondary | ICD-10-CM | POA: Diagnosis not present

## 2017-02-09 DIAGNOSIS — I10 Essential (primary) hypertension: Secondary | ICD-10-CM | POA: Diagnosis not present

## 2017-02-09 DIAGNOSIS — R55 Syncope and collapse: Secondary | ICD-10-CM | POA: Diagnosis not present

## 2017-02-09 DIAGNOSIS — R2681 Unsteadiness on feet: Secondary | ICD-10-CM | POA: Diagnosis not present

## 2017-02-09 DIAGNOSIS — M6281 Muscle weakness (generalized): Secondary | ICD-10-CM | POA: Diagnosis not present

## 2017-02-09 DIAGNOSIS — R638 Other symptoms and signs concerning food and fluid intake: Secondary | ICD-10-CM | POA: Diagnosis not present

## 2017-02-09 DIAGNOSIS — N183 Chronic kidney disease, stage 3 (moderate): Secondary | ICD-10-CM | POA: Diagnosis not present

## 2017-02-09 DIAGNOSIS — E119 Type 2 diabetes mellitus without complications: Secondary | ICD-10-CM | POA: Diagnosis not present

## 2017-02-09 DIAGNOSIS — R262 Difficulty in walking, not elsewhere classified: Secondary | ICD-10-CM | POA: Diagnosis not present

## 2017-02-09 DIAGNOSIS — I509 Heart failure, unspecified: Secondary | ICD-10-CM | POA: Diagnosis not present

## 2017-02-09 DIAGNOSIS — E860 Dehydration: Secondary | ICD-10-CM | POA: Diagnosis not present

## 2017-02-09 DIAGNOSIS — E785 Hyperlipidemia, unspecified: Secondary | ICD-10-CM | POA: Diagnosis not present

## 2017-02-09 DIAGNOSIS — I87399 Chronic venous hypertension (idiopathic) with other complications of unspecified lower extremity: Secondary | ICD-10-CM | POA: Diagnosis not present

## 2017-02-09 DIAGNOSIS — I82629 Acute embolism and thrombosis of deep veins of unspecified upper extremity: Secondary | ICD-10-CM | POA: Diagnosis not present

## 2017-02-09 DIAGNOSIS — J9600 Acute respiratory failure, unspecified whether with hypoxia or hypercapnia: Secondary | ICD-10-CM | POA: Diagnosis not present

## 2017-02-09 DIAGNOSIS — A419 Sepsis, unspecified organism: Secondary | ICD-10-CM | POA: Diagnosis not present

## 2017-02-09 DIAGNOSIS — Z95810 Presence of automatic (implantable) cardiac defibrillator: Secondary | ICD-10-CM | POA: Diagnosis not present

## 2017-02-09 DIAGNOSIS — Z950 Presence of cardiac pacemaker: Secondary | ICD-10-CM | POA: Diagnosis not present

## 2017-02-09 DIAGNOSIS — R188 Other ascites: Secondary | ICD-10-CM | POA: Diagnosis not present

## 2017-02-09 DIAGNOSIS — R4020 Unspecified coma: Secondary | ICD-10-CM | POA: Diagnosis not present

## 2017-02-09 DIAGNOSIS — I959 Hypotension, unspecified: Secondary | ICD-10-CM | POA: Diagnosis not present

## 2017-02-09 DIAGNOSIS — N189 Chronic kidney disease, unspecified: Secondary | ICD-10-CM | POA: Diagnosis not present

## 2017-02-09 DIAGNOSIS — R531 Weakness: Secondary | ICD-10-CM | POA: Diagnosis not present

## 2017-02-09 DIAGNOSIS — R6520 Severe sepsis without septic shock: Secondary | ICD-10-CM | POA: Diagnosis not present

## 2017-02-09 DIAGNOSIS — E7800 Pure hypercholesterolemia, unspecified: Secondary | ICD-10-CM | POA: Diagnosis not present

## 2017-02-09 DIAGNOSIS — E1165 Type 2 diabetes mellitus with hyperglycemia: Secondary | ICD-10-CM | POA: Diagnosis not present

## 2017-02-09 DIAGNOSIS — I82622 Acute embolism and thrombosis of deep veins of left upper extremity: Secondary | ICD-10-CM | POA: Diagnosis not present

## 2017-02-09 DIAGNOSIS — Z794 Long term (current) use of insulin: Secondary | ICD-10-CM | POA: Diagnosis not present

## 2017-02-09 DIAGNOSIS — E1169 Type 2 diabetes mellitus with other specified complication: Secondary | ICD-10-CM | POA: Diagnosis not present

## 2017-02-09 DIAGNOSIS — I872 Venous insufficiency (chronic) (peripheral): Secondary | ICD-10-CM | POA: Diagnosis not present

## 2017-02-09 DIAGNOSIS — Z7901 Long term (current) use of anticoagulants: Secondary | ICD-10-CM | POA: Diagnosis not present

## 2017-02-09 DIAGNOSIS — R489 Unspecified symbolic dysfunctions: Secondary | ICD-10-CM | POA: Diagnosis not present

## 2017-02-09 DIAGNOSIS — I5022 Chronic systolic (congestive) heart failure: Secondary | ICD-10-CM | POA: Diagnosis not present

## 2017-02-10 DIAGNOSIS — I10 Essential (primary) hypertension: Secondary | ICD-10-CM | POA: Diagnosis not present

## 2017-02-10 DIAGNOSIS — I4891 Unspecified atrial fibrillation: Secondary | ICD-10-CM | POA: Diagnosis not present

## 2017-02-10 DIAGNOSIS — I5022 Chronic systolic (congestive) heart failure: Secondary | ICD-10-CM | POA: Diagnosis not present

## 2017-02-10 DIAGNOSIS — E1165 Type 2 diabetes mellitus with hyperglycemia: Secondary | ICD-10-CM | POA: Diagnosis not present

## 2017-02-11 DIAGNOSIS — I5022 Chronic systolic (congestive) heart failure: Secondary | ICD-10-CM | POA: Diagnosis not present

## 2017-02-11 DIAGNOSIS — I872 Venous insufficiency (chronic) (peripheral): Secondary | ICD-10-CM | POA: Diagnosis not present

## 2017-02-11 DIAGNOSIS — I82622 Acute embolism and thrombosis of deep veins of left upper extremity: Secondary | ICD-10-CM | POA: Diagnosis not present

## 2017-02-11 DIAGNOSIS — I10 Essential (primary) hypertension: Secondary | ICD-10-CM | POA: Diagnosis not present

## 2017-02-25 DEATH — deceased

## 2018-01-28 IMAGING — DX DG CHEST 2V
3 series · 3 of 3 positions shown · non-contrast
Comparison: 01/19/2017

CLINICAL DATA: Shortness of breath, left-sided weakness,
hypertension, diabetes

EXAM:
CHEST  2 VIEW

[chest lat (1 of 2)]
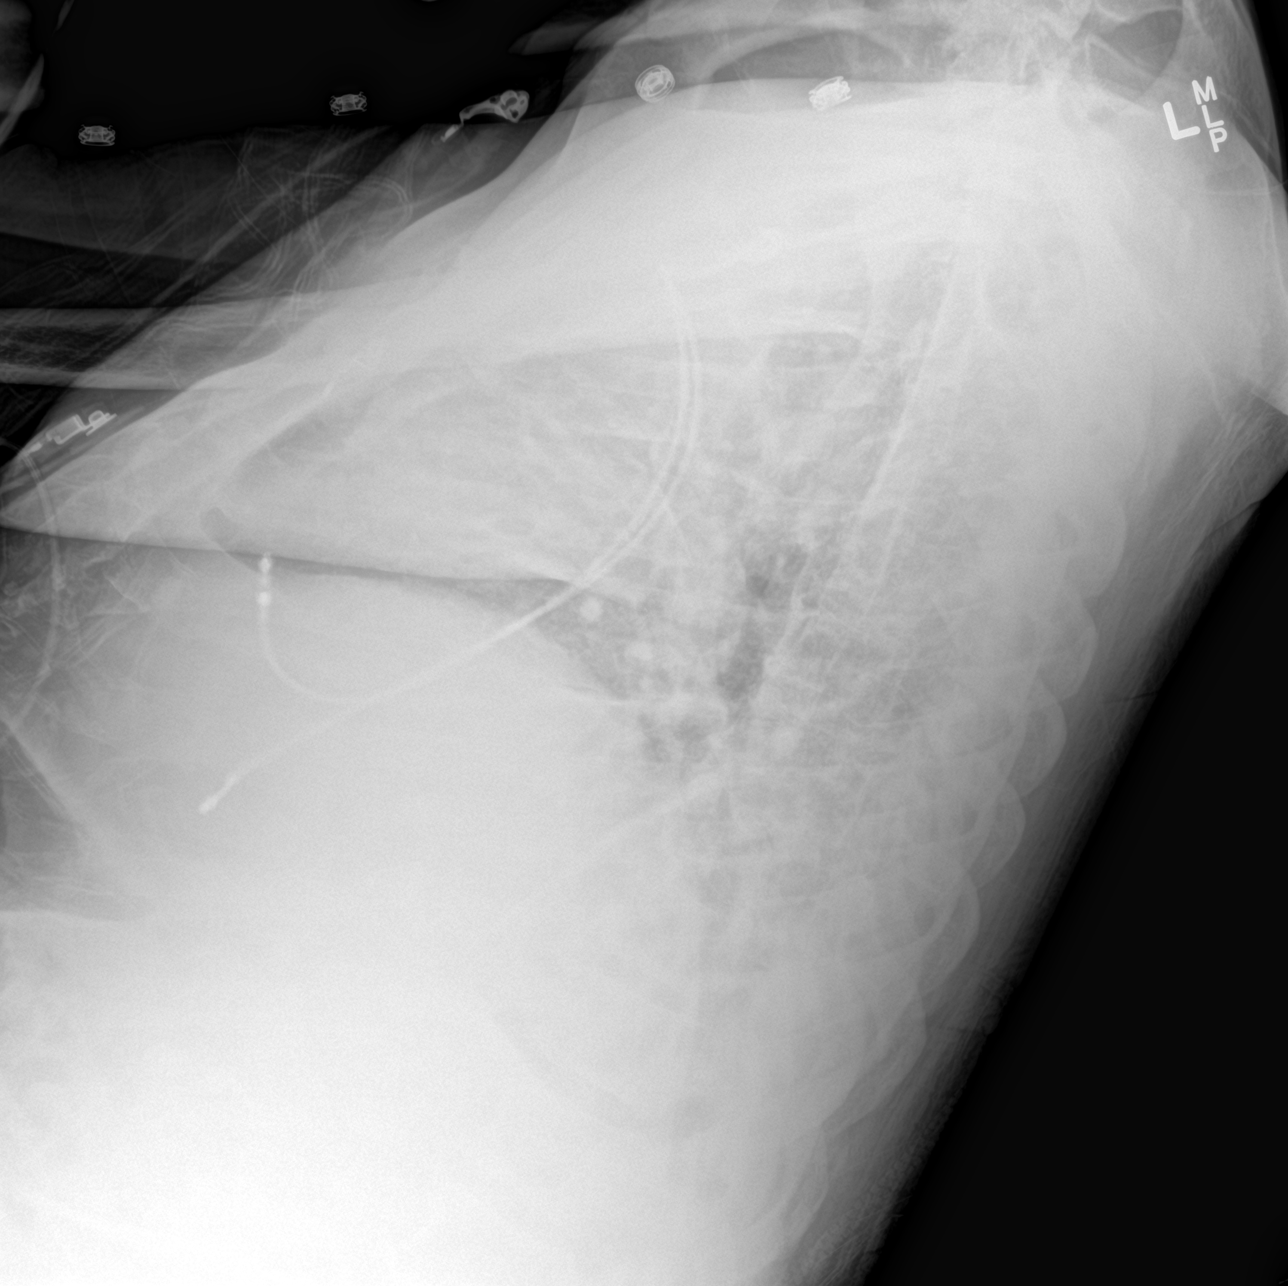

[chest ap]
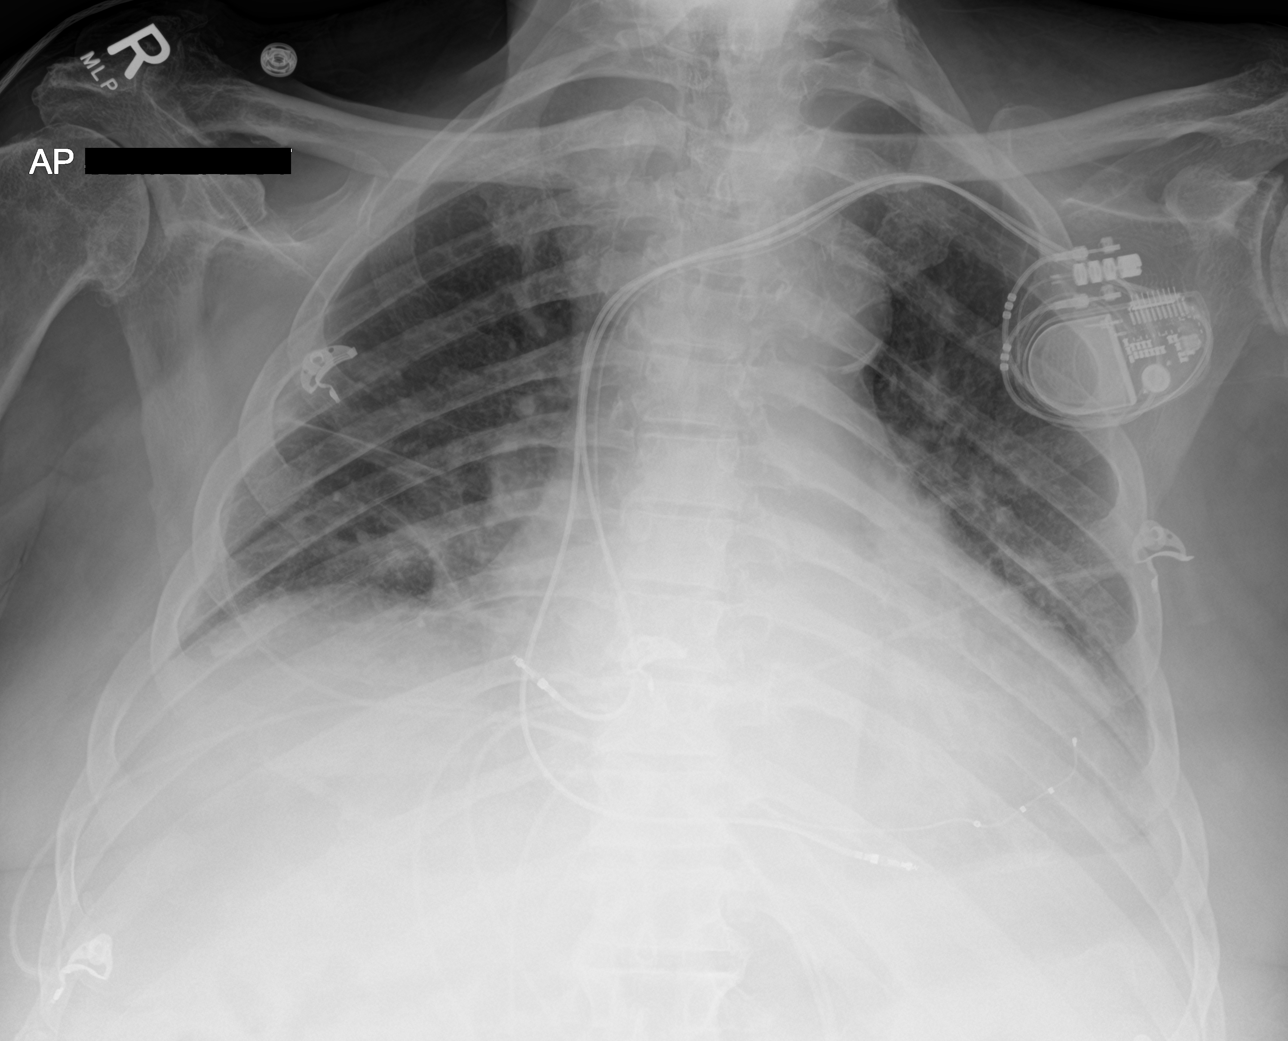

[chest lat (2 of 2)]
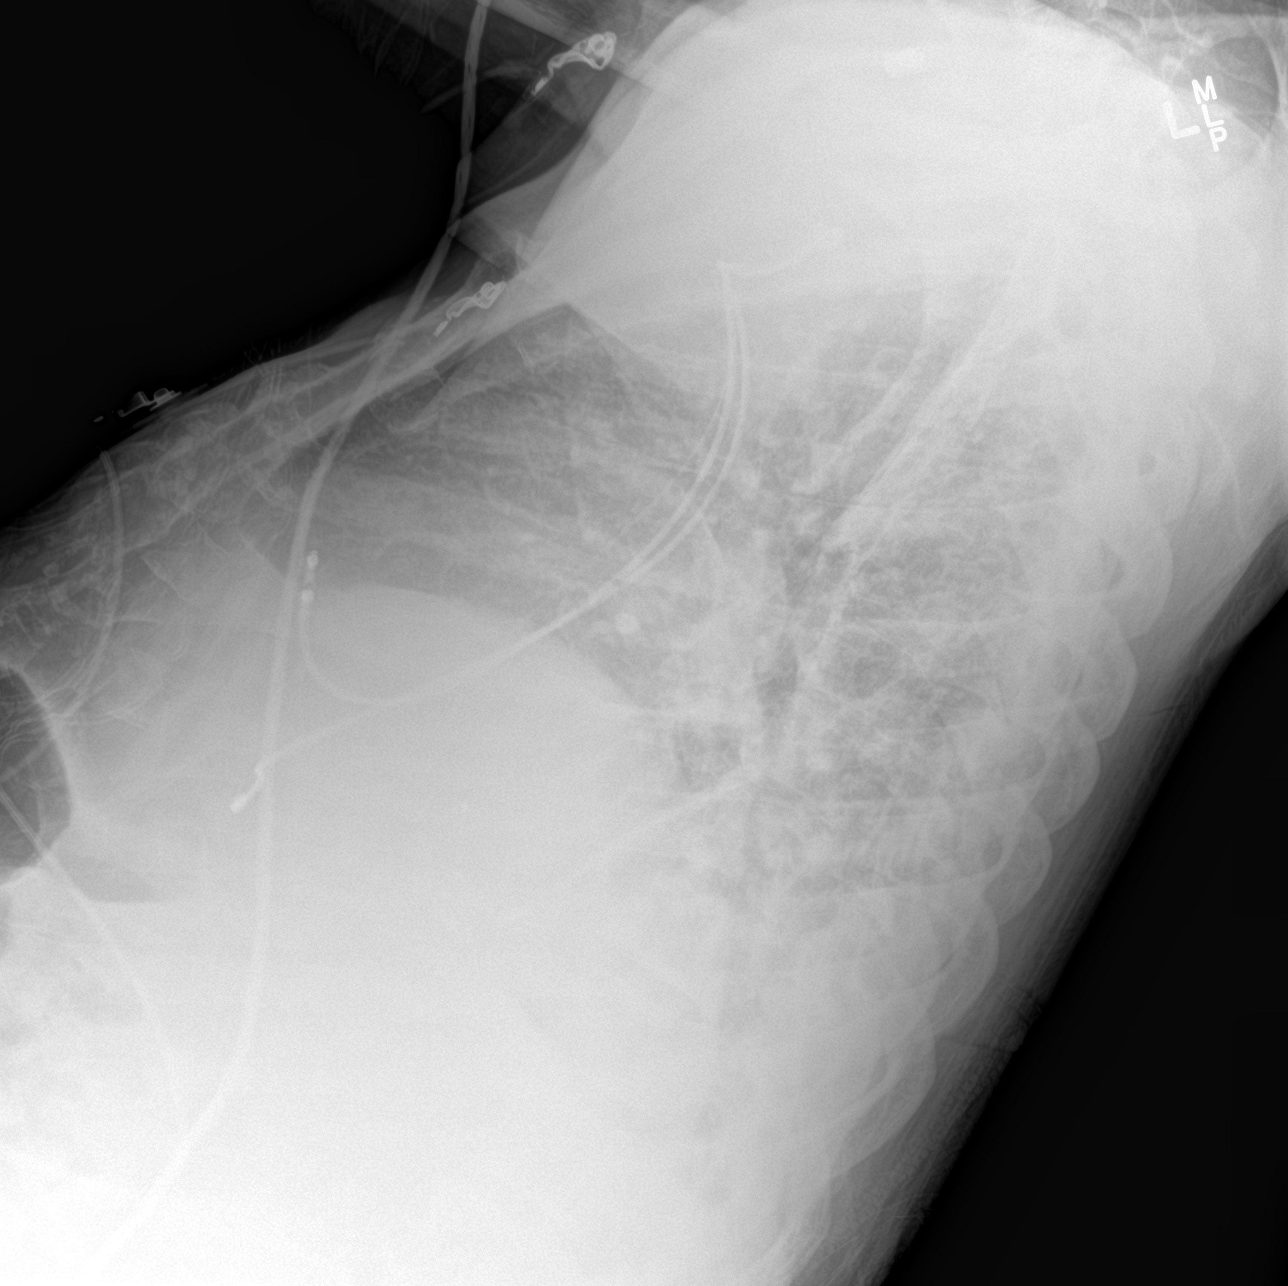

[3 of 3 positions shown; findings below may reference images not displayed]

FINDINGS: Marked cardiomegaly with vascular and interstitial prominence
compatible with mild edema. Small pleural effusions present
bilaterally with basilar atelectasis. Upper lobes remain clear. No
pneumothorax. Trachea is midline. Aorta is atherosclerotic. Left
subclavian pacemaker evident. Degenerative changes of the spine and
shoulders.
IMPRESSION: Cardiomegaly with mild interstitial edema pattern, bibasilar
atelectasis, and small effusions compatible with CHF

Thoracic aortic atherosclerosis
